# Patient Record
Sex: Female | Born: 1990 | Hispanic: No | Marital: Single | Smoking: Never smoker
Health system: Southern US, Community
[De-identification: ages and names within clinical notes are randomized; demographics above are authoritative.]

## PROBLEM LIST (undated history)

## (undated) ENCOUNTER — Inpatient Hospital Stay: Payer: Self-pay

## (undated) DIAGNOSIS — T7840XA Allergy, unspecified, initial encounter: Secondary | ICD-10-CM

## (undated) DIAGNOSIS — G510 Bell's palsy: Secondary | ICD-10-CM

## (undated) HISTORY — PX: OTHER SURGICAL HISTORY: SHX169

## (undated) HISTORY — DX: Allergy, unspecified, initial encounter: T78.40XA

## (undated) HISTORY — PX: WISDOM TOOTH EXTRACTION: SHX21

---

## 2012-02-04 ENCOUNTER — Emergency Department: Payer: Self-pay | Admitting: Emergency Medicine

## 2012-02-04 LAB — URINALYSIS, COMPLETE
Bacteria: NONE SEEN
Nitrite: NEGATIVE
Specific Gravity: 1.009 (ref 1.003–1.030)
WBC UR: 5 /HPF (ref 0–5)

## 2012-02-04 LAB — PREGNANCY, URINE: Pregnancy Test, Urine: NEGATIVE m[IU]/mL

## 2012-02-04 LAB — CBC WITH DIFFERENTIAL/PLATELET
Basophil #: 0.1 10*3/uL (ref 0.0–0.1)
Basophil %: 1.2 %
Eosinophil #: 0.3 10*3/uL (ref 0.0–0.7)
HGB: 14.6 g/dL (ref 12.0–16.0)
Lymphocyte #: 1.5 10*3/uL (ref 1.0–3.6)
MCHC: 34.6 g/dL (ref 32.0–36.0)
MCV: 88 fL (ref 80–100)
Monocyte #: 0.5 x10 3/mm (ref 0.2–0.9)
Monocyte %: 9.4 %
Neutrophil #: 3.1 10*3/uL (ref 1.4–6.5)
Platelet: 246 10*3/uL (ref 150–440)
RBC: 4.78 10*6/uL (ref 3.80–5.20)
RDW: 12.9 % (ref 11.5–14.5)
WBC: 5.4 10*3/uL (ref 3.6–11.0)

## 2012-02-04 LAB — COMPREHENSIVE METABOLIC PANEL
Anion Gap: 8 (ref 7–16)
Calcium, Total: 9.3 mg/dL (ref 8.5–10.1)
Co2: 27 mmol/L (ref 21–32)
EGFR (Non-African Amer.): >60
Osmolality: 279 (ref 275–301)
Potassium: 3.7 mmol/L (ref 3.5–5.1)
Sodium: 140 mmol/L (ref 136–145)

## 2015-07-19 ENCOUNTER — Emergency Department
Admission: EM | Admit: 2015-07-19 | Discharge: 2015-07-20 | Disposition: A | Discharge status: Home / Self Care | Payer: Self-pay | Attending: Emergency Medicine | Admitting: Emergency Medicine

## 2015-07-19 ENCOUNTER — Encounter: Payer: Self-pay | Admitting: *Deleted

## 2015-07-19 DIAGNOSIS — L989 Disorder of the skin and subcutaneous tissue, unspecified: Secondary | ICD-10-CM | POA: Insufficient documentation

## 2015-07-19 DIAGNOSIS — L03113 Cellulitis of right upper limb: Secondary | ICD-10-CM | POA: Insufficient documentation

## 2015-07-19 DIAGNOSIS — R05 Cough: Secondary | ICD-10-CM | POA: Insufficient documentation

## 2015-07-19 HISTORY — DX: Bell's palsy: G51.0

## 2015-07-19 NOTE — ED Notes (Signed)
Patient states she had an abscess on right forearm that became worse 3 weeks ago. Area is opened, no drainage noted, redness around the wound and hot to the touch. Patient states she has begun to have pain in the elbow and shoulder today.

## 2015-07-20 LAB — CBC WITH DIFFERENTIAL/PLATELET
BASOS ABS: 0.1 10*3/uL (ref 0–0.1)
BASOS PCT: 1 %
EOS ABS: 0.4 10*3/uL (ref 0–0.7)
EOS PCT: 5 %
HCT: 34.8 % — ABNORMAL LOW (ref 35.0–47.0)
Hemoglobin: 11.9 g/dL — ABNORMAL LOW (ref 12.0–16.0)
LYMPHS ABS: 2.4 10*3/uL (ref 1.0–3.6)
Lymphocytes Relative: 32 %
MCH: 30.8 pg (ref 26.0–34.0)
MCHC: 34.3 g/dL (ref 32.0–36.0)
MCV: 89.8 fL (ref 80.0–100.0)
Monocytes Absolute: 0.6 10*3/uL (ref 0.2–0.9)
Monocytes Relative: 8 %
Neutro Abs: 4 10*3/uL (ref 1.4–6.5)
Neutrophils Relative %: 54 %
Platelets: 207 10*3/uL (ref 150–440)
RBC: 3.88 MIL/uL (ref 3.80–5.20)
RDW: 13.2 % (ref 11.5–14.5)
WBC: 7.5 10*3/uL (ref 3.6–11.0)

## 2015-07-20 LAB — BASIC METABOLIC PANEL
Anion gap: 5 (ref 5–15)
BUN: 14 mg/dL (ref 6–20)
CO2: 23 mmol/L (ref 22–32)
CREATININE: 0.67 mg/dL (ref 0.44–1.00)
Calcium: 8.9 mg/dL (ref 8.9–10.3)
Chloride: 112 mmol/L — ABNORMAL HIGH (ref 101–111)
GFR calc Af Amer: >60 mL/min (ref >60)
Glucose, Bld: 86 mg/dL (ref 65–99)
POTASSIUM: 3.3 mmol/L — AB (ref 3.5–5.1)
Sodium: 140 mmol/L (ref 135–145)

## 2015-07-20 MED ORDER — CLINDAMYCIN HCL 300 MG PO CAPS: 300.0000 mg | ORAL_CAPSULE | Freq: Three times a day (TID) | ORAL | Status: DC

## 2015-07-20 MED ORDER — CLINDAMYCIN HCL 150 MG PO CAPS
300.0000 mg | ORAL_CAPSULE | Freq: Once | ORAL | Status: AC
  Administered 2015-07-20: 300 mg via ORAL
  Filled 2015-07-20: qty 2

## 2015-07-20 NOTE — ED Provider Notes (Signed)
[facility] Emergency Department Provider Note  ____________________________________________  Time seen: Approximately 12:46 AM  I have reviewed the triage vital signs and the nursing notes.   HISTORY  Chief Complaint Abscess    HPI Michelle Arellano is a 25 y.o. female who comes in with something on her right arm. The patient reports this been there for 3 weeks. She reports that she noticed a pimple there more than 6 months ago and it wasn't anything. She reports that 3 weeks ago though it became bigger. The patient reports that she tried to poke it and then it ripped. She reports since then she's been trying to keep it covered. The patient noticed some soreness in her elbow and her shoulder and a fever 3 weeks ago so she decided to come in to get this area checked out. The patient reports though that when she had the fever she also had a cough. The patient reports that she took Tylenol at week ago and the arm hurts about 3-4 out of 10 in intensity.    Past Medical History  Diagnosis Date  . Bell's palsy     There are no active problems to display for this patient.   History reviewed. No pertinent past surgical history.  Current Outpatient Rx  Name  Route  Sig  Dispense  Refill  . clindamycin (CLEOCIN) 300 MG capsule   Oral   Take 1 capsule (300 mg total) by mouth 3 (three) times daily.   21 capsule   0     Allergies Review of patient's allergies indicates no known allergies.  No family history on file.  Social History Social History  Substance Use Topics  . Smoking status: Never Smoker   . Smokeless tobacco: None  . Alcohol Use: No    Review of Systems Constitutional: fever/chills Eyes: No visual changes. ENT: No sore throat. Cardiovascular: Denies chest pain. Respiratory: cough Gastrointestinal: No abdominal pain.  No nausea, no vomiting.  No diarrhea.  No constipation. Genitourinary: Negative for dysuria. Musculoskeletal: Negative  for back pain. Skin: lesion to right arm Neurological: Negative for headaches, focal weakness or numbness.  10-point ROS otherwise negative.  ____________________________________________   PHYSICAL EXAM:  VITAL SIGNS: ED Triage Vitals  Enc Vitals Group     BP 07/19/15 2241 130/70 mmHg     Pulse Rate 07/19/15 2241 72     Resp 07/19/15 2241 18     Temp 07/19/15 2241 97.9 F (36.6 C)     Temp Source 07/19/15 2241 Oral     SpO2 07/19/15 2241 98 %     Weight 07/19/15 2241 115 lb (52.164 kg)     Height 07/19/15 2241 5\' 1"  (1.549 m)     Head Cir --      Peak Flow --      Pain Score 07/19/15 2242 4     Pain Loc --      Pain Edu? --      Excl. in GC? --     Constitutional: Alert and oriented. Well appearing and in mild distress. Eyes: Conjunctivae are normal. PERRL. EOMI. Head: Atraumatic. Nose: No congestion/rhinnorhea. Mouth/Throat: Mucous membranes are moist.  Oropharynx non-erythematous. Cardiovascular: Normal rate, regular rhythm. Grossly normal heart sounds.  Good peripheral circulation. Respiratory: Normal respiratory effort.  No retractions. Lungs CTAB. Gastrointestinal: Soft and nontender. No distention. Positive bowel sounds Musculoskeletal: No lower extremity tenderness nor edema.   Neurologic:  Normal speech and language.  Skin:  Keratinized lesion to right  forearm.  2.5 cm x 2 cm Psychiatric: Mood and affect are normal.   ____________________________________________   LABS (all labs ordered are listed, but only abnormal results are displayed)  Labs Reviewed  CBC WITH DIFFERENTIAL/PLATELET - Abnormal; Notable for the following:    Hemoglobin 11.9 (*)    HCT 34.8 (*)    All other components within normal limits  BASIC METABOLIC PANEL - Abnormal; Notable for the following:    Potassium 3.3 (*)    Chloride 112 (*)    All other components within normal limits    ____________________________________________  EKG  none ____________________________________________  RADIOLOGY  none ____________________________________________   PROCEDURES  Procedure(s) performed: None  Critical Care performed: No  ____________________________________________   INITIAL IMPRESSION / ASSESSMENT AND PLAN / ED COURSE  Pertinent labs & imaging results that were available during my care of the patient were reviewed by me and considered in my medical decision making (see chart for details).  This is a 25 year old female who comes in today with a lesion to her right forearm as well as some mild soreness to her right elbow and shoulder. The patient does have a lesion to her forearm that's keratinized but with some mild erythema around it. We will check a CBC and a BMP and I will also give the patient a dose of clindamycin. The patient likely needs to have this area biopsied by dermatology but I will check her blood work first.  The patient's blood work looks unremarkable. Again this patient has a keratinized lesion on her right forearm that slightly fungating with no active drainage. I am concerned and think that this patient needs to have this lesion removed and evaluated by dermatology. The patient otherwise has no acute distress and no other concerns. She does have some erythema around the area so I will treat her for a mild cellulitis but she does need to follow-up. ____________________________________________   FINAL CLINICAL IMPRESSION(S) / ED DIAGNOSES  Final diagnoses:  Arm skin lesion, right  Cellulitis of right upper extremity      Rebecka Apley, MD 07/20/15 [phone]

## 2015-07-20 NOTE — Discharge Instructions (Signed)
Cellulitis Cellulitis is an infection of the skin and the tissue under the skin. The infected area is usually red and tender. This happens most often in the arms and lower legs. HOME CARE   Take your antibiotic medicine as told. Finish the medicine even if you start to feel better.  Keep the infected arm or leg raised (elevated).  Put a warm cloth on the area up to 4 times per day.  Only take medicines as told by your doctor.  Keep all doctor visits as told. GET HELP IF:  You see red streaks on the skin coming from the infected area.  Your red area gets bigger or turns a dark color.  Your bone or joint under the infected area is painful after the skin heals.  Your infection comes back in the same area or different area.  You have a puffy (swollen) bump in the infected area.  You have new symptoms.  You have a fever. GET HELP RIGHT AWAY IF:   You feel very sleepy.  You throw up (vomit) or have watery poop (diarrhea).  You feel sick and have muscle aches and pains.   This information is not intended to replace advice given to you by your health care provider. Make sure you discuss any questions you have with your health care provider.   Document Released: 12/15/2007 Document Revised: 03/19/2015 Document Reviewed: 09/13/2011 Elsevier Interactive Patient Education 2016 [employer]  Excision of Skin Lesions Excision of a skin lesion refers to the removal of a section of skin by making small cuts (incisions) in the skin. This procedure may be done to remove a cancerous (malignant) or noncancerous (benign) growth on the skin. It is typically done to treat or prevent cancer or infection. It may also be done to improve cosmetic appearance. The procedure may be done to remove:  Cancerous growths, such as basal cell carcinoma, squamous cell carcinoma, or melanoma.  Noncancerous growths, such as a cyst or lipoma.  Growths, such as moles or skin tags, which may be removed for  cosmetic reasons. Various excision or surgical techniques may be used depending on your condition, the location of the lesion, and your overall health. LET [facility] PROVIDER KNOW ABOUT:  Any allergies you have.  All medicines you are taking, including vitamins, herbs, eye drops, creams, and over-the-counter medicines.  Previous problems you or members of your family have had with the use of anesthetics.  Any blood disorders you have.  Previous surgeries you have had.  Any medical conditions you have.  Whether you are pregnant or may be pregnant. RISKS AND COMPLICATIONS Generally, this is a safe procedure. However, problems may occur, including:  Bleeding.  Infection.  Scarring.  Recurrence of the cyst, lipoma, or cancer.  Changes in skin sensation or appearance, such as discoloration or swelling.  Reaction to the anesthetics.  Allergic reaction to surgical materials or ointments.  Damage to nerves, blood vessels, muscles, or other structures.  Continued pain. BEFORE THE PROCEDURE  Ask your health care provider about:  Changing or stopping your regular medicines. This is especially important if you are taking diabetes medicines or blood thinners.  Taking medicines such as aspirin and ibuprofen. These medicines can thin your blood. Do not take these medicines before your procedure if your health care provider instructs you not to.  You may be asked to take certain medicines.  You may be asked to stop smoking.  You may have an exam or testing.  Plan  to have someone take you home after the procedure.  Plan to have someone help you with activities during recovery. PROCEDURE  To reduce your risk of infection:  Your health care team will wash or sanitize their hands.  Your skin will be washed with soap.  You will be given a medicine to numb the area (local anesthetic).  One of the following excision techniques will be performed.  At the end of any of  these procedures, antibiotic ointment will be applied as needed. Each of the following techniques may vary among health care providers and hospitals. Complete Surgical Excision The area of skin that needs to be removed will be marked with a pen. Using a small scalpel or scissors, the surgeon will gently cut around and under the lesion until it is completely removed. The lesion will be placed in a fluid and sent to the lab for examination. If necessary, bleeding will be controlled with a device that delivers heat (electrocautery). The edges of the wound may be stitched (sutured) together, and a bandage (dressing) will be applied. This procedure may be performed to treat a cancerous growth or a noncancerous cyst or lesion. Excision of a Cyst The surgeon will make an incision on the cyst. The entire cyst will be removed through the incision. The incision may be closed with sutures. Shave Excision During shave excision, the surgeon will use a small blade or an electrically heated loop instrument to shave off the lesion. This may be done to remove a mole or a skin tag. The wound will usually be left to heal on its own without sutures. Punch Excision During punch excision, the surgeon will use a small tool that is like a cookie cutter or a hole punch to cut a circle shape out of the skin. The outer edges of the skin will be sutured together. This may be done to remove a mole or a scar or to perform a biopsy of the lesion. Mohs Micrographic Surgery During Mohs micrographic surgery, layers of the lesion will be removed with a scalpel or a loop instrument and will be examined right away under a microscope. Layers will be removed until all of the abnormal or cancerous tissue has been removed. This procedure is minimally invasive, and it ensures the best cosmetic outcome. It involves the removal of as little normal tissue as possible. Mohs is usually done to treat skin cancer, such as basal cell carcinoma or  squamous cell carcinoma, particularly on the face and ears. Depending on the size of the surgical wound, it may be sutured closed. AFTER THE PROCEDURE  Return to your normal activities as told by your health care provider.  Talk with your health care provider to discuss any test results, treatment options, and if necessary, the need for more tests.   This information is not intended to replace advice given to you by your health care provider. Make sure you discuss any questions you have with your health care provider.   Document Released: 09/22/2009 Document Revised: 03/19/2015 Document Reviewed: 08/14/2014 Elsevier Interactive Patient Education 2016 Elsevier Inc.  Skin Biopsy WHY AM I HAVING THIS TEST? This test examines skin tissue under a microscope. Your health care provider may perform this test to identify the source of a skin rash or lesion if an allergy is suspected. In this test, a tissue sample (biopsy) is taken to look at your skin's anatomy and to see if certain proteins and antibodies are present. WHAT KIND OF SAMPLE IS TAKEN?  A small sample of skin is required for this test. It is usually collected by numbing an area of skin and removing a small circle of skin. HOW DO I PREPARE FOR THE TEST? There is no preparation required for this test. HOW ARE THE TEST RESULTS REPORTED? Your results may be reported in different ways and are dependent upon the kind of test that is performed. It is your responsibility to obtain your test results. Ask the lab or department performing the test when and how you will get your results. It is most common for your results to be reported as:  Normal skin anatomy (histology).  No evidence of IgG, IgA, or IgM antibody, complement C3, or fibrinogen. WHAT DO THE RESULTS MEAN? Abnormal findings may indicate certain autoimmune diseases. This test can produce many different results. It is important to talk with your health care provider to discuss your  results, treatment options, and if necessary, the need for more tests. Talk with your health care provider if you have any questions about your results.   This information is not intended to replace advice given to you by your health care provider. Make sure you discuss any questions you have with your health care provider.   Document Released: 07/30/2004 Document Revised: 07/19/2014 Document Reviewed: 09/25/2014 Elsevier Interactive Patient Education [employer]

## 2016-07-12 NOTE — L&D Delivery Note (Signed)
0500 Called in room to see patient, RN reports patient complete with bulging bag of water. Requests AROM. SVE: 10/100/+1, vertex, BBOW, caput present.   Maternal pushing efforts initiated. Spontaneous rupture of membranes at 0512, small amount of clear fluid. Maternal pushing efforts continued with little progress due to fetal head position despite frequent position changes and coached pushing. Dr. Valentino Saxon called at [phone] for evaluation for possible assisted delivery.   Dr Valentino Saxon in room to evaluate patient at 17. Effective maternal pushing efforts noted. SVE: 10/100/+3, vertex. Room prepared for second and third stage.   Spontaneous vaginal birth of liveborn female patient at 38. Infant immediately to maternal abdomen. Delayed cord clamping. Three (3) vessel cord. Cord blood collected. APGARS: 8, 9. Weight 6 + 10 pounds. Receiving nurse present at bedside for birth.   Pitocin infusing. Spontaneous delivery of placenta at 0826. First degree perineal laceration and right labial laceration repaired with 3-0 vicryl rapide with epidural and local anesthesia. Left labial laceration, hemostatic unrepaired. EBL: 350 ml.   Initiate routine postpartum care and orders. Mom to postpartum.  Baby to Couplet care / Skin to Skin.   Gunnar Bulla, CNM 04/15/2017, 9:08 AM

## 2016-08-20 ENCOUNTER — Encounter: Payer: Self-pay | Admitting: Certified Nurse Midwife

## 2016-08-20 ENCOUNTER — Ambulatory Visit (INDEPENDENT_AMBULATORY_CARE_PROVIDER_SITE_OTHER): Payer: BLUE CROSS/BLUE SHIELD | Admitting: Certified Nurse Midwife

## 2016-08-20 ENCOUNTER — Other Ambulatory Visit (INDEPENDENT_AMBULATORY_CARE_PROVIDER_SITE_OTHER): Payer: BLUE CROSS/BLUE SHIELD

## 2016-08-20 VITALS — BP 112/66 | HR 86 | Ht 61.0 in | Wt 102.2 lb

## 2016-08-20 DIAGNOSIS — N912 Amenorrhea, unspecified: Secondary | ICD-10-CM

## 2016-08-20 DIAGNOSIS — N926 Irregular menstruation, unspecified: Secondary | ICD-10-CM

## 2016-08-20 DIAGNOSIS — Z3201 Encounter for pregnancy test, result positive: Secondary | ICD-10-CM

## 2016-08-20 LAB — POCT URINE PREGNANCY: PREG TEST UR: POSITIVE — AB

## 2016-08-20 NOTE — Patient Instructions (Signed)

## 2016-08-20 NOTE — Progress Notes (Signed)
GYN ENCOUNTER NOTE  Subjective:       Michelle Arellano is a 26 y.o. [id number] female is here for gynecologic evaluation of the following issues: amenorrhea and positive home pregnancy test. She is accompanied by her significant other, Michelle Arellano.   Michelle Arellano reports a positive home pregnancy test on 08/16/2016. She endorses nausea without vomiting that she is managing with saltines and ginger ale.   She is not currently taking a PNV.   Denies difficulty breathing or respiratory distress, chest pain, abdominal pain, vaginal bleeding, and leg pain or swelling.   Gynecologic History  Patient's last menstrual period was 07/02/2016 (exact date). Irregular  EDD: 04/08/2017  Gestational age: 27 weeks 0 days  Contraception: none   Last Pap: 2017 . Results were: normal  Obstetric History OB History  Gravida Para Term Preterm AB Living  2       1    SAB TAB Ectopic Multiple Live Births    1          # Outcome Date GA Lbr Len/2nd Weight Sex Delivery Anes PTL Lv  2 Current           1 TAB 2006              Past Medical History:  Diagnosis Date  . Bell's palsy     Past Surgical History:  Procedure Laterality Date  . cyst removed     rt arm    No current outpatient prescriptions on file prior to visit.   No current facility-administered medications on file prior to visit.     No Known Allergies  Social History   Social History  . Marital status: Single    Spouse name: N/A  . Number of children: N/A  . Years of education: N/A   Occupational History  . Not on file.   Social History Main Topics  . Smoking status: Never Smoker  . Smokeless tobacco: Never Used  . Alcohol use Yes     Comment: wine occas  . Drug use: No  . Sexual activity: Yes    Birth control/ protection: None   Other Topics Concern  . Not on file   Social History Narrative  . No narrative on file    Family History  Problem Relation Age of Onset  . Diabetes Mother   . Heart disease Maternal  Grandmother   . Heart failure Maternal Grandfather   . Lung cancer Maternal Grandfather   . Asthma Neg Hx   . Ovarian cancer Neg Hx   . Colon cancer Neg Hx     The following portions of the patient's history were reviewed and updated as appropriate: allergies, current medications, past family history, past medical history, past social history, past surgical history and problem list.  Review of Systems  Review of Systems - Negative except as noted above History obtained from the patient   Objective:   BP 112/66   Pulse 86   Ht 5\' 1"  (1.549 m)   Wt 102 lb 3.2 oz (46.4 kg)   LMP 07/02/2016 (Exact Date)   BMI 19.31 kg/m   Alert and oriented x 4  Positive UPT  Physical exam: not indicated  Assessment:   Amenorrhea  Positive pregnancy test    Plan:   1. Viability and dating US today  2. Encouraged PNV with folic acid and DHA  3. Reviewed red flag symptoms and when to call  4. RTC x 1-2 weeks for Nurse intake   Lovell SheehanJenkins  Dani Gobble, CNM

## 2016-08-27 ENCOUNTER — Ambulatory Visit (INDEPENDENT_AMBULATORY_CARE_PROVIDER_SITE_OTHER): Payer: BLUE CROSS/BLUE SHIELD | Admitting: Certified Nurse Midwife

## 2016-08-27 VITALS — BP 99/63 | HR 66 | Ht 62.0 in | Wt 96.0 lb

## 2016-08-27 DIAGNOSIS — Z113 Encounter for screening for infections with a predominantly sexual mode of transmission: Secondary | ICD-10-CM

## 2016-08-27 DIAGNOSIS — Z1389 Encounter for screening for other disorder: Secondary | ICD-10-CM

## 2016-08-27 DIAGNOSIS — Z3481 Encounter for supervision of other normal pregnancy, first trimester: Secondary | ICD-10-CM

## 2016-08-27 NOTE — Progress Notes (Signed)
Michelle BootyAlyssa G Arellano presents for NOB nurse interview visit. Pregnancy confirmation done on 08/20/2016 JML. UPT-positive.  G-2. P-0010. Pregnancy education material explained and given.  No cats in the home. NOB labs ordered.  HIV labs and Drug screen were explained optional and she did not decline. Drug screen ordered. Pt states she did smoke MJ a couple weeks ago but none since she found out she was pregnant. PNV encouraged. Genetic screening options discussed. FOB mother's uncle has Down's. Pt's brother has autism, FOB mother has heart valve defect, and pt has short ring fingers with the knuckle being further down and unable to see it on both hands, along with her sister, both on father's side.  Genetic testing: may do MaterniT.   Pt may discuss with provider. Pt. To follow up with provider in 3-4 weeks for NOB physical.  All questions answered.

## 2016-08-27 NOTE — Patient Instructions (Signed)
Pregnancy and Zika Virus Disease Introduction Zika virus disease, or Zika, is an illness that can spread to people from mosquitoes that carry the virus. It may also spread from person to person through infected body fluids. Zika first occurred in [place], but recently it has spread to new areas. The virus occurs in tropical climates. The location of Zika continues to change. Most people who become infected with Zika virus do not develop serious illness. However, Zika may cause birth defects in an unborn baby whose mother is infected with the virus. It may also increase the risk of miscarriage. What are the symptoms of Zika virus disease? In many cases, people who have been infected with Zika virus do not develop any symptoms. If symptoms appear, they usually start about a week after the person is infected. Symptoms are usually mild. They may include:  Fever.  Rash.  Red eyes.  Joint pain. How does Zika virus disease spread? The main way that Zika virus spreads is through the bite of a certain type of mosquito. Unlike most types of mosquitos, which bite only at night, the type of mosquito that carries Zika virus bites both at night and during the day. Zika virus can also spread through sexual contact, through a blood transfusion, and from a mother to her baby before or during birth. Once you have had Zika virus disease, it is unlikely that you will get it again. Can I pass Zika to my baby during pregnancy? Yes, Zika can pass from a mother to her baby before or during birth. What problems can Zika cause for my baby? A woman who is infected with Zika virus while pregnant is at risk of having her baby born with a condition in which the brain or head is smaller than expected (microcephaly). Babies who have microcephaly can have developmental delays, seizures, hearing problems, and vision problems. Having Zika virus disease during pregnancy can also increase the risk of miscarriage. How can Zika  virus disease be prevented? There is no vaccine to prevent Zika. The best way to prevent the disease is to avoid infected mosquitoes and avoid exposure to body fluids that can spread the virus. Avoid any possible exposure to Zika by taking the following precautions. For women and their sex partners:  Avoid traveling to high-risk areas. The locations where Zika is being reported change often. To identify high-risk areas, check the CDC travel website: www.cdc.gov/zika/geo/index.html  If you or your sex partner must travel to a high-risk area, talk with a health care provider before and after traveling.  Take all precautions to avoid mosquito bites if you live in, or travel to, any of the high-risk areas. Insect repellents are safe to use during pregnancy.  Ask your health care provider when it is safe to have sexual contact. For women:  If you are pregnant or trying to become pregnant, avoid sexual contact with persons who may have been exposed to Zika virus, persons who have possible symptoms of Zika, or persons whose history you are unsure about. If you choose to have sexual contact with someone who may have been exposed to Zika virus, use condoms correctly during the entire duration of sexual activity, every time. Do not share sexual devices, as you may be exposed to body fluids.  Ask your health care provider about when it is safe to attempt pregnancy after a possible exposure to Zika virus. What steps should I take to avoid mosquito bites? Take these steps to avoid mosquito bites when you   are in a high-risk area:  Wear loose clothing that covers your arms and legs.  Limit your outdoor activities.  Do not open windows unless they have window screens.  Sleep under mosquito nets.  Use insect repellent. The best insect repellents have:  DEET, picaridin, oil of lemon eucalyptus (OLE), or IR3535 in them.  Higher amounts of an active ingredient in them.  Remember that insect repellents  are safe to use during pregnancy.  Do not use OLE on children who are younger than 3 years of age. Do not use insect repellent on babies who are younger than 2 months of age.  Cover your child's stroller with mosquito netting. Make sure the netting fits snugly and that any loose netting does not cover your child's mouth or nose. Do not use a blanket as a mosquito-protection cover.  Do not apply insect repellent underneath clothing.  If you are using sunscreen, apply the sunscreen before applying the insect repellent.  Treat clothing with permethrin. Do not apply permethrin directly to your skin. Follow label directions for safe use.  Get rid of standing water, where mosquitoes may reproduce. Standing water is often found in items such as buckets, bowls, animal food dishes, and flowerpots. When you return from traveling to any high-risk area, continue taking actions to protect yourself against mosquito bites for 3 weeks, even if you show no signs of illness. This will prevent spreading Zika virus to uninfected mosquitoes. What should I know about the sexual transmission of Zika? People can spread Zika to their sexual partners during vaginal, anal, or oral sex, or by sharing sexual devices. Many people with Zika do not develop symptoms, so a person could spread the disease without knowing that they are infected. The greatest risk is to women who are pregnant or who may become pregnant. Zika virus can live longer in semen than it can live in blood. Couples can prevent sexual transmission of the virus by:  Using condoms correctly during the entire duration of sexual activity, every time. This includes vaginal, anal, and oral sex.  Not sharing sexual devices. Sharing increases your risk of being exposed to body fluid from another person.  Avoiding all sexual activity until your health care provider says it is safe. Should I be tested for Zika virus? A sample of your blood can be tested for Zika  virus. A pregnant woman should be tested if she may have been exposed to the virus or if she has symptoms of Zika. She may also have additional tests done during her pregnancy, such ultrasound testing. Talk with your health care provider about which tests are recommended. This information is not intended to replace advice given to you by your health care provider. Make sure you discuss any questions you have with your health care provider. Document Released: 03/19/2015 Document Revised: 12/04/2015 Document Reviewed: 03/12/2015  2017 Elsevier Minor Illnesses and Medications in Pregnancy  Cold/Flu:  Sudafed for congestion- Robitussin (plain) for cough- Tylenol for discomfort.  Please follow the directions on the label.  Try not to take any more than needed.  OTC Saline nasal spray and air humidifier or cool-mist  Vaporizer to sooth nasal irritation and to loosen congestion.  It is also important to increase intake of non carbonated fluids, especially if you have a fever.  Constipation:  Colace-2 capsules at bedtime; Metamucil- follow directions on label; Senokot- 1 tablet at bedtime.  Any one of these medications can be used.  It is also very important to increase   fluids and fruits along with regular exercise.  If problem persists please call the office.  Diarrhea:  Kaopectate as directed on the label.  Eat a bland diet and increase fluids.  Avoid highly seasoned foods.  Headache:  Tylenol 1 or 2 tablets every 3-4 hours as needed  Indigestion:  Maalox, Mylanta, Tums or Rolaids- as directed on label.  Also try to eat small meals and avoid fatty, greasy or spicy foods.  Nausea with or without Vomiting:  Nausea in pregnancy is caused by increased levels of hormones in the body which influence the digestive system and cause irritation when stomach acids accumulate.  Symptoms usually subside after 1st trimester of pregnancy.  Try the following: 1. Keep saltines, graham crackers or dry toast by your bed to  eat upon awakening. 2. Don't let your stomach get empty.  Try to eat 5-6 small meals per day instead of 3 large ones. 3. Avoid greasy fatty or highly seasoned foods.  4. Take OTC Unisom 1 tablet at bed time along with OTC Vitamin B6 25-50 mg 3 times per day.    If nausea continues with vomiting and you are unable to keep down food and fluids you may need a prescription medication.  Please notify your provider.   Sore throat:  Chloraseptic spray, throat lozenges and or plain Tylenol.  Vaginal Yeast Infection:  OTC Monistat for 7 days as directed on label.  If symptoms do not resolve within a week notify provider.  If any of the above problems do not subside with recommended treatment please call the office for further assistance.   Do not take Aspirin, Advil, Motrin or Ibuprofen.  * * OTC= Over the counter Hyperemesis Gravidarum Hyperemesis gravidarum is a severe form of nausea and vomiting that happens during pregnancy. Hyperemesis is worse than morning sickness. It may cause you to have nausea or vomiting all day for many days. It may keep you from eating and drinking enough food and liquids. Hyperemesis usually occurs during the first half (the first 20 weeks) of pregnancy. It often goes away once a woman is in her second half of pregnancy. However, sometimes hyperemesis continues through an entire pregnancy. What are the causes? The cause of this condition is not known. It may be related to changes in chemicals (hormones) in the body during pregnancy, such as the high level of pregnancy hormone (human chorionic gonadotropin) or the increase in the female sex hormone (estrogen). What are the signs or symptoms? Symptoms of this condition include:  Severe nausea and vomiting.  Nausea that does not go away.  Vomiting that does not allow you to keep any food down.  Weight loss.  Body fluid loss (dehydration).  Having no desire to eat, or not liking food that you have previously  enjoyed. How is this diagnosed? This condition may be diagnosed based on:  A physical exam.  Your medical history.  Your symptoms.  Blood tests.  Urine tests. How is this treated? This condition may be managed with medicine. If medicines to do not help relieve nausea and vomiting, you may need to receive fluids through an IV tube at the hospital. Follow these instructions at home:  Take over-the-counter and prescription medicines only as told by your health care provider.  Avoid iron pills and multivitamins that contain iron for the first 3-4 months of pregnancy. If you take prescription iron pills, do not stop taking them unless your health care provider approves.  Take the following actions to help   prevent nausea and vomiting:  In the morning, before getting out of bed, try eating a couple of dry crackers or a piece of toast.  Avoid foods and smells that upset your stomach. Fatty and spicy foods may make nausea worse.  Eat 5-6 small meals a day.  Do not drink fluids while eating meals. Drink between meals.  Eat or suck on things that have ginger in them. Ginger can help relieve nausea.  Avoid food preparation. The smell of food can spoil your appetite or trigger nausea.  Follow instructions from your health care provider about eating or drinking restrictions.  For snacks, eat high-protein foods, such as cheese.  Keep all follow-up and pre-birth (prenatal) visits as told by your health care provider. This is important. Contact a health care provider if:  You have pain in your abdomen.  You have a severe headache.  You have vision problems.  You are losing weight. Get help right away if:  You cannot drink fluids without vomiting.  You vomit blood.  You have constant nausea and vomiting.  You are very weak.  You are very thirsty.  You feel dizzy.  You faint.  You have a fever or other symptoms that last for more than 2-3 days.  You have a fever and  your symptoms suddenly get worse. Summary  Hyperemesis gravidarum is a severe form of nausea and vomiting that happens during pregnancy.  Making some changes to your eating habits may help relieve nausea and vomiting.  This condition may be managed with medicine.  If medicines to do not help relieve nausea and vomiting, you may need to receive fluids through an IV tube at the hospital. This information is not intended to replace advice given to you by your health care provider. Make sure you discuss any questions you have with your health care provider. Document Released: 06/28/2005 Document Revised: 02/25/2016 Document Reviewed: 02/25/2016 Elsevier Interactive Patient Education  2017 [employer] Commonly Asked Questions During Pregnancy  Cats: A parasite can be excreted in cat feces.  To avoid exposure you need to have another person empty the little box.  If you must empty the litter box you will need to wear gloves.  Wash your hands after handling your cat.  This parasite can also be found in raw or undercooked meat so this should also be avoided.  Colds, Sore Throats, Flu: Please check your medication sheet to see what you can take for symptoms.  If your symptoms are unrelieved by these medications please call the office.  Dental Work: Most any dental work your dentist recommends is permitted.  X-rays should only be taken during the first trimester if absolutely necessary.  Your abdomen should be shielded with a lead apron during all x-rays.  Please notify your provider prior to receiving any x-rays.  Novocaine is fine; gas is not recommended.  If your dentist requires a note from us prior to dental work please call the office and we will provide one for you.  Exercise: Exercise is an important part of staying healthy during your pregnancy.  You may continue most exercises you were accustomed to prior to pregnancy.  Later in your pregnancy you will most likely notice you have difficulty  with activities requiring balance like riding a bicycle.  It is important that you listen to your body and avoid activities that put you at a higher risk of falling.  Adequate rest and staying well hydrated are a must!  If you have questions   about the safety of specific activities ask your provider.    Exposure to Children with illness: Try to avoid obvious exposure; report any symptoms to us when noted,  If you have chicken pos, red measles or mumps, you should be immune to these diseases.   Please do not take any vaccines while pregnant unless you have checked with your OB provider.  Fetal Movement: After 28 weeks we recommend you do "kick counts" twice daily.  Lie or sit down in a calm quiet environment and count your baby movements "kicks".  You should feel your baby at least 10 times per hour.  If you have not felt 10 kicks within the first hour get up, walk around and have something sweet to eat or drink then repeat for an additional hour.  If count remains less than 10 per hour notify your provider.  Fumigating: Follow your pest control agent's advice as to how long to stay out of your home.  Ventilate the area well before re-entering.  Hemorrhoids:   Most over-the-counter preparations can be used during pregnancy.  Check your medication to see what is safe to use.  It is important to use a stool softener or fiber in your diet and to drink lots of liquids.  If hemorrhoids seem to be getting worse please call the office.   Hot Tubs:  Hot tubs Jacuzzis and saunas are not recommended while pregnant.  These increase your internal body temperature and should be avoided.  Intercourse:  Sexual intercourse is safe during pregnancy as long as you are comfortable, unless otherwise advised by your provider.  Spotting may occur after intercourse; report any bright red bleeding that is heavier than spotting.  Labor:  If you know that you are in labor, please go to the hospital.  If you are unsure, please  call the office and let us help you decide what to do.  Lifting, straining, etc:  If your job requires heavy lifting or straining please check with your provider for any limitations.  Generally, you should not lift items heavier than that you can lift simply with your hands and arms (no back muscles)  Painting:  Paint fumes do not harm your pregnancy, but may make you ill and should be avoided if possible.  Latex or water based paints have less odor than oils.  Use adequate ventilation while painting.  Permanents & Hair Color:  Chemicals in hair dyes are not recommended as they cause increase hair dryness which can increase hair loss during pregnancy.  " Highlighting" and permanents are allowed.  Dye may be absorbed differently and permanents may not hold as well during pregnancy.  Sunbathing:  Use a sunscreen, as skin burns easily during pregnancy.  Drink plenty of fluids; avoid over heating.  Tanning Beds:  Because their possible side effects are still unknown, tanning beds are not recommended.  Ultrasound Scans:  Routine ultrasounds are performed at approximately 20 weeks.  You will be able to see your baby's general anatomy an if you would like to know the gender this can usually be determined as well.  If it is questionable when you conceived you may also receive an ultrasound early in your pregnancy for dating purposes.  Otherwise ultrasound exams are not routinely performed unless there is a medical necessity.  Although you can request a scan we ask that you pay for it when conducted because insurance does not cover " patient request" scans.  Work: If your pregnancy proceeds without complications you   may work until your due date, unless your physician or employer advises otherwise.  Round Ligament Pain/Pelvic Discomfort:  Sharp, shooting pains not associated with bleeding are fairly common, usually occurring in the second trimester of pregnancy.  They tend to be worse when standing up or when  you remain standing for long periods of time.  These are the result of pressure of certain pelvic ligaments called "round ligaments".  Rest, Tylenol and heat seem to be the most effective relief.  As the womb and fetus grow, they rise out of the pelvis and the discomfort improves.  Please notify the office if your pain seems different than that described.  It may represent a more serious condition.   

## 2016-08-28 LAB — CBC WITH DIFFERENTIAL/PLATELET
BASOS ABS: 0.1 10*3/uL (ref 0.0–0.2)
Basos: 1 %
EOS (ABSOLUTE): 0.1 10*3/uL (ref 0.0–0.4)
Eos: 2 %
HEMATOCRIT: 38.2 % (ref 34.0–46.6)
HEMOGLOBIN: 13.4 g/dL (ref 11.1–15.9)
Immature Grans (Abs): 0 10*3/uL (ref 0.0–0.1)
Immature Granulocytes: 0 %
LYMPHS ABS: 1.1 10*3/uL (ref 0.7–3.1)
Lymphs: 19 %
MCH: 31.6 pg (ref 26.6–33.0)
MCHC: 35.1 g/dL (ref 31.5–35.7)
MCV: 90 fL (ref 79–97)
MONOCYTES: 10 %
MONOS ABS: 0.6 10*3/uL (ref 0.1–0.9)
NEUTROS ABS: 4.2 10*3/uL (ref 1.4–7.0)
Neutrophils: 68 %
Platelets: 259 10*3/uL (ref 150–379)
RBC: 4.24 x10E6/uL (ref 3.77–5.28)
RDW: 14 % (ref 12.3–15.4)
WBC: 6.1 10*3/uL (ref 3.4–10.8)

## 2016-08-28 LAB — HEPATITIS B SURFACE ANTIGEN: HEP B S AG: NEGATIVE

## 2016-08-28 LAB — RPR: RPR Ser Ql: NONREACTIVE

## 2016-08-28 LAB — HIV ANTIBODY (ROUTINE TESTING W REFLEX): HIV Screen 4th Generation wRfx: NONREACTIVE

## 2016-08-28 LAB — ABO

## 2016-08-28 LAB — RUBELLA SCREEN: RUBELLA: 1.73 {index} (ref >0.99)

## 2016-08-28 LAB — VARICELLA ZOSTER ANTIBODY, IGG: VARICELLA: 3355 {index} (ref >165)

## 2016-08-28 LAB — RH TYPE: Rh Factor: POSITIVE

## 2016-08-28 LAB — ANTIBODY SCREEN: ANTIBODY SCREEN: NEGATIVE

## 2016-08-29 LAB — URINE CULTURE, OB REFLEX: ORGANISM ID, BACTERIA: NO GROWTH

## 2016-08-29 LAB — CULTURE, OB URINE

## 2016-08-30 LAB — GC/CHLAMYDIA PROBE AMP
Chlamydia trachomatis, NAA: NEGATIVE
Neisseria gonorrhoeae by PCR: NEGATIVE

## 2016-08-31 LAB — NICOTINE SCREEN, URINE: COTININE UR QL SCN: NEGATIVE ng/mL

## 2016-08-31 LAB — URINALYSIS, ROUTINE W REFLEX MICROSCOPIC
BILIRUBIN UA: NEGATIVE
GLUCOSE, UA: NEGATIVE
Leukocytes, UA: NEGATIVE
NITRITE UA: NEGATIVE
Protein, UA: NEGATIVE
RBC UA: NEGATIVE
SPEC GRAV UA: 1.026 (ref 1.005–1.030)
UUROB: 0.2 mg/dL (ref 0.2–1.0)
pH, UA: 7 (ref 5.0–7.5)

## 2016-08-31 LAB — MONITOR DRUG PROFILE 14(MW)
Amphetamine Scrn, Ur: NEGATIVE ng/mL
BARBITURATE SCREEN URINE: NEGATIVE ng/mL
BENZODIAZEPINE SCREEN, URINE: NEGATIVE ng/mL
Buprenorphine, Urine: NEGATIVE ng/mL
COCAINE(METAB.)SCREEN, URINE: NEGATIVE ng/mL
Creatinine(Crt), U: 165.9 mg/dL (ref 20.0–300.0)
FENTANYL, URINE: NEGATIVE pg/mL
MEPERIDINE SCREEN, URINE: NEGATIVE ng/mL
Methadone Screen, Urine: NEGATIVE ng/mL
OPIATE SCREEN URINE: NEGATIVE ng/mL
OXYCODONE+OXYMORPHONE UR QL SCN: NEGATIVE ng/mL
Ph of Urine: 7 (ref 4.5–8.9)
Phencyclidine Qn, Ur: NEGATIVE ng/mL
Propoxyphene Scrn, Ur: NEGATIVE ng/mL
SPECIFIC GRAVITY: 1.02
TRAMADOL SCREEN, URINE: NEGATIVE ng/mL

## 2016-08-31 LAB — CANNABINOID (GC/MS), URINE
Cannabinoid: POSITIVE — AB
Carboxy THC (GC/MS): >300 ng/mL

## 2016-09-17 ENCOUNTER — Encounter: Payer: Self-pay | Admitting: Certified Nurse Midwife

## 2016-09-17 ENCOUNTER — Ambulatory Visit (INDEPENDENT_AMBULATORY_CARE_PROVIDER_SITE_OTHER): Payer: BLUE CROSS/BLUE SHIELD | Admitting: Certified Nurse Midwife

## 2016-09-17 VITALS — BP 112/54 | HR 89 | Wt 99.2 lb

## 2016-09-17 DIAGNOSIS — Z681 Body mass index (BMI) 19 or less, adult: Secondary | ICD-10-CM

## 2016-09-17 DIAGNOSIS — Z124 Encounter for screening for malignant neoplasm of cervix: Secondary | ICD-10-CM

## 2016-09-17 DIAGNOSIS — Z3481 Encounter for supervision of other normal pregnancy, first trimester: Secondary | ICD-10-CM

## 2016-09-17 LAB — POCT URINALYSIS DIPSTICK
BILIRUBIN UA: NEGATIVE
Glucose, UA: NEGATIVE
Ketones, UA: NEGATIVE
LEUKOCYTES UA: NEGATIVE
NITRITE UA: NEGATIVE
RBC UA: NEGATIVE
Spec Grav, UA: 1.01
UROBILINOGEN UA: NEGATIVE
pH, UA: 7.5

## 2016-09-17 NOTE — Progress Notes (Signed)
NOB PE- No complaints.

## 2016-09-17 NOTE — Progress Notes (Signed)
NEW OB HISTORY AND PHYSICAL  SUBJECTIVE:       Michelle Arellano is a 26 y.o. [id number] female, Patient's last menstrual period was 07/02/2016 (exact date)., Estimated Date of Delivery: 04/08/17, [redacted]w[redacted]d, presents today for establishment of Prenatal Care.  She reports that her nausea is improving with home measures. She takes a prenatal vitamin daily.  Desires second trimester screening.   Denies difficulty breathing or respiratory distress, visual changes, chest pain, abdominal pain, vaginal bleeding, and leg pain or swelling.    Gynecologic History  Patient's last menstrual period was 07/02/2016 (exact date).   Last Pap: 2017. Results were: normal  Obstetric History OB History  Gravida Para Term Preterm AB Living  2       1    SAB TAB Ectopic Multiple Live Births    1          # Outcome Date GA Lbr Len/2nd Weight Sex Delivery Anes PTL Lv  2 Current           1 TAB 2006              Past Medical History:  Diagnosis Date  . Bell's palsy     Past Surgical History:  Procedure Laterality Date  . cyst removed     rt arm    Current Outpatient Prescriptions on File Prior to Visit  Medication Sig Dispense Refill  . prenatal vitamin w/FE, FA (PRENATAL 1 + 1) 27-1 MG TABS tablet Take 1 tablet by mouth daily at 12 noon.     No current facility-administered medications on file prior to visit.     No Known Allergies  Social History   Social History  . Marital status: Single    Spouse name: N/A  . Number of children: N/A  . Years of education: N/A   Occupational History  . Not on file.   Social History Main Topics  . Smoking status: Never Smoker  . Smokeless tobacco: Never Used  . Alcohol use Yes     Comment: wine occas  . Drug use: Yes    Frequency: 1.0 time per week    Types: Marijuana     Comment: sttopped when she found out she was pregnant  . Sexual activity: Yes    Partners: Male    Birth control/ protection: None   Other Topics Concern  . Not on file    Social History Narrative  . No narrative on file    Family History  Problem Relation Age of Onset  . Diabetes Mother   . Heart disease Maternal Grandmother   . Heart failure Maternal Grandfather   . Lung cancer Maternal Grandfather   . Asthma Neg Hx   . Ovarian cancer Neg Hx   . Colon cancer Neg Hx     The following portions of the patient's history were reviewed and updated as appropriate: allergies, current medications, past OB history, past medical history, past surgical history, past family history, past social history, and problem list.  OBJECTIVE: Initial Physical Exam (New OB)  GENERAL APPEARANCE: alert, well appearing, in no apparent distress  HEAD: normocephalic, atraumatic  MOUTH: mucous membranes moist, pharynx normal without lesions and dental hygiene good  THYROID: no thyromegaly or masses present  BREASTS: no masses noted, no significant tenderness, no palpable axillary nodes, no skin changes  LUNGS: clear to auscultation, no wheezes, rales or rhonchi, symmetric air entry  HEART: regular rate and rhythm, no murmurs  ABDOMEN: soft, nontender, nondistended, no abnormal masses,  no epigastric pain, fundus not palpable and FHT present  EXTREMITIES: no redness or tenderness in the calves or thighs, no edema  SKIN: normal coloration and turgor, no rashes, 25% covered in professional tattoos  LYMPH NODES: no adenopathy palpable  NEUROLOGIC: alert, oriented, normal speech, no focal findings or movement disorder noted  PELVIC EXAM EXTERNAL GENITALIA: normal appearing vulva with no masses, tenderness or lesions VAGINA: no abnormal discharge or lesions CERVIX: no lesions or cervical motion tenderness UTERUS: gravid and consistent with 11 weeks ADNEXA: no masses palpable and nontender  ASSESSMENT: Normal pregnancy BMI <19  PLAN: Prenatal care Discussed healthy eating and weight gain in pregnancy Desires second trimester screening See orders   Gunnar BullaJenkins  Michelle Kyzen Horn, CNM

## 2016-09-17 NOTE — Patient Instructions (Signed)
Second Trimester of Pregnancy The second trimester is from week 13 through week 28, month 4 through 6. This is often the time in pregnancy that you feel your best. Often times, morning sickness has lessened or quit. You may have more energy, and you may get hungry more often. Your unborn baby (fetus) is growing rapidly. At the end of the sixth month, he or she is about 9 inches long and weighs about 1 pounds. You will likely feel the baby move (quickening) between 18 and 20 weeks of pregnancy. Follow these instructions at home:  Avoid all smoking, herbs, and alcohol. Avoid drugs not approved by your doctor.  Do not use any tobacco products, including cigarettes, chewing tobacco, and electronic cigarettes. If you need help quitting, ask your doctor. You may get counseling or other support to help you quit.  Only take medicine as told by your doctor. Some medicines are safe and some are not during pregnancy.  Exercise only as told by your doctor. Stop exercising if you start having cramps.  Eat regular, healthy meals.  Wear a good support bra if your breasts are tender.  Do not use hot tubs, steam rooms, or saunas.  Wear your seat belt when driving.  Avoid raw meat, uncooked cheese, and liter boxes and soil used by cats.  Take your prenatal vitamins.  Take 1500-2000 milligrams of calcium daily starting at the 20th week of pregnancy until you deliver your baby.  Try taking medicine that helps you poop (stool softener) as needed, and if your doctor approves. Eat more fiber by eating fresh fruit, vegetables, and whole grains. Drink enough fluids to keep your pee (urine) clear or pale yellow.  Take warm water baths (sitz baths) to soothe pain or discomfort caused by hemorrhoids. Use hemorrhoid cream if your doctor approves.  If you have puffy, bulging veins (varicose veins), wear support hose. Raise (elevate) your feet for 15 minutes, 3-4 times a day. Limit salt in your diet.  Avoid heavy  lifting, wear low heals, and sit up straight.  Rest with your legs raised if you have leg cramps or low back pain.  Visit your dentist if you have not gone during your pregnancy. Use a soft toothbrush to brush your teeth. Be gentle when you floss.  You can have sex (intercourse) unless your doctor tells you not to.  Go to your doctor visits. Get help if:  You feel dizzy.  You have mild cramps or pressure in your lower belly (abdomen).  You have a nagging pain in your belly area.  You continue to feel sick to your stomach (nauseous), throw up (vomit), or have watery poop (diarrhea).  You have bad smelling fluid coming from your vagina.  You have pain with peeing (urination). Get help right away if:  You have a fever.  You are leaking fluid from your vagina.  You have spotting or bleeding from your vagina.  You have severe belly cramping or pain.  You lose or gain weight rapidly.  You have trouble catching your breath and have chest pain.  You notice sudden or extreme puffiness (swelling) of your face, hands, ankles, feet, or legs.  You have not felt the baby move in over an hour.  You have severe headaches that do not go away with medicine.  You have vision changes. This information is not intended to replace advice given to you by your health care provider. Make sure you discuss any questions you have with your health care   provider. Document Released: 09/22/2009 Document Revised: 12/04/2015 Document Reviewed: 08/29/2012 Elsevier Interactive Patient Education  2017 [employer]  

## 2016-09-22 LAB — PAP IG W/ RFLX HPV ASCU: PAP SMEAR COMMENT: 0

## 2016-09-27 ENCOUNTER — Encounter: Payer: Self-pay | Admitting: Certified Nurse Midwife

## 2016-10-15 ENCOUNTER — Ambulatory Visit (INDEPENDENT_AMBULATORY_CARE_PROVIDER_SITE_OTHER): Payer: BLUE CROSS/BLUE SHIELD | Admitting: Certified Nurse Midwife

## 2016-10-15 ENCOUNTER — Encounter: Payer: Self-pay | Admitting: Certified Nurse Midwife

## 2016-10-15 VITALS — BP 99/55 | HR 81 | Wt 105.0 lb

## 2016-10-15 DIAGNOSIS — Z3402 Encounter for supervision of normal first pregnancy, second trimester: Secondary | ICD-10-CM

## 2016-10-15 LAB — POCT URINALYSIS DIPSTICK
BILIRUBIN UA: NEGATIVE
Glucose, UA: NEGATIVE
Ketones, UA: NEGATIVE
LEUKOCYTES UA: NEGATIVE
NITRITE UA: NEGATIVE
PH UA: 8 (ref 5.0–8.0)
Protein, UA: NEGATIVE
RBC UA: NEGATIVE
Spec Grav, UA: 1.01 (ref 1.030–1.035)
UROBILINOGEN UA: NEGATIVE (ref ?–2.0)

## 2016-10-15 NOTE — Progress Notes (Signed)
ROB- want AFP tetra.

## 2016-10-15 NOTE — Progress Notes (Signed)
ROB-Pt doing well. Completed treatment for a sinus infection last week. Desires second trimester screen with next visit. Reviewed red flag symptoms and when to call. RTC x 4 weeks for anatomy scan, Quad screen, and ROB.

## 2016-10-15 NOTE — Patient Instructions (Signed)
Alpha-Fetoprotein Test Why am I having this test? This test is used to screen for birth defects, such as chromosomal abnormalities, neural tube defects, and body wall defects. It can also be used as a tumor marker for certain cancers. Alpha-Fetoprotein (AFP) is a protein that is made by the fetal liver. Levels can be detected during pregnancy, starting at [redacted] weeks gestation and peaking at 16-[redacted] weeks gestation. Your health care provider may perform this test if you are pregnant or if a tumor is suspected. What kind of sample is taken? A blood sample is required for this test. It is usually collected by inserting a needle into a vein. How do I prepare for this test? There is no preparation or fasting required for this test. What are the reference ranges? References ranges are considered healthy ranges established after testing a large group of healthy people. Reference ranges may vary among different people, labs, and hospitals. It is your responsibility to obtain your test results. Ask the lab or department performing the test when and how you will get your results. Reference ranges for AFP are the following:  Adult: Less than 40 ng/mL or less than 40 mcg/L (SI units).  Child younger than 1 year: Less than 30 ng/mL. Ranges are stratified by weeks of gestation, and they vary among laboratories. What do the results mean? Values above the reference ranges in pregnant women may indicate:  Neural tube defects.  Abdominal wall defects.  Multiple pregnancy.  Congenital abnormalities.  Fetal distress or fetal death. Values above the reference ranges in nonpregnant women may indicate:  Reproductive cancers.  Liver cancer.  Liver cell death.  Other types of cancer. Values below the reference ranges in pregnant women may indicate:  Down syndrome.  Fetal death. Talk with your health care provider to discuss your results, treatment options, and if necessary, the need for more tests. Talk  with your health care provider if you have any questions about your results. Talk with your health care provider to discuss your results, treatment options, and if necessary, the need for more tests. Talk with your health care provider if you have any questions about your results. This information is not intended to replace advice given to you by your health care provider. Make sure you discuss any questions you have with your health care provider. Document Released: 07/22/2004 Document Revised: 03/01/2016 Document Reviewed: 11/30/2013 Elsevier Interactive Patient Education  2017 [employer].

## 2016-11-12 ENCOUNTER — Other Ambulatory Visit: Payer: Self-pay | Admitting: Certified Nurse Midwife

## 2016-11-12 ENCOUNTER — Ambulatory Visit (INDEPENDENT_AMBULATORY_CARE_PROVIDER_SITE_OTHER): Payer: BLUE CROSS/BLUE SHIELD

## 2016-11-12 ENCOUNTER — Ambulatory Visit (INDEPENDENT_AMBULATORY_CARE_PROVIDER_SITE_OTHER): Payer: BLUE CROSS/BLUE SHIELD | Admitting: Certified Nurse Midwife

## 2016-11-12 VITALS — BP 100/61 | HR 84 | Wt 111.8 lb

## 2016-11-12 DIAGNOSIS — Z3482 Encounter for supervision of other normal pregnancy, second trimester: Secondary | ICD-10-CM

## 2016-11-12 DIAGNOSIS — Z3402 Encounter for supervision of normal first pregnancy, second trimester: Secondary | ICD-10-CM

## 2016-11-12 DIAGNOSIS — Z3A19 19 weeks gestation of pregnancy: Secondary | ICD-10-CM

## 2016-11-12 DIAGNOSIS — O283 Abnormal ultrasonic finding on antenatal screening of mother: Secondary | ICD-10-CM

## 2016-11-12 DIAGNOSIS — Z3490 Encounter for supervision of normal pregnancy, unspecified, unspecified trimester: Secondary | ICD-10-CM | POA: Insufficient documentation

## 2016-11-12 LAB — POCT URINALYSIS DIPSTICK
Bilirubin, UA: NEGATIVE
Blood, UA: NEGATIVE
GLUCOSE UA: NEGATIVE
KETONES UA: NEGATIVE
Nitrite, UA: NEGATIVE
PROTEIN UA: NEGATIVE
SPEC GRAV UA: 1.025 (ref 1.010–1.025)
UROBILINOGEN UA: 0.2 U/dL
pH, UA: 6 (ref 5.0–8.0)

## 2016-11-12 NOTE — Progress Notes (Signed)
ROB doing well. Anatomy scan today. There is an echogenic focus seen in the fetal left cardiac ventricle, all other anatomy appears WNL.Reviewed finding with Janat. Consult to MFM for evaluation. She denies LOF, vaginal bleeding, and contractions. Endorses good fetal movement. Second trimester screening today. ROB in 4 wks.   Doreene BurkeAnnie Doaa Kendzierski, CNM  ULTRASOUND REPORT  Location: [facility] Date of Service: 11/12/16  Indications: Anatomy Findings:  Singleton intrauterine pregnancy is visualized with FHR at 157 BPM. Biometrics give an (U/S) Gestational age of [redacted] weeks and 3 days, and an (U/S) EDD of 04/12/17; this correlates with the clinically established EDD of 04/08/17.  Fetal presentation is breech, spine posterior.  EFW: 249 grams ( 0 lbs. 9 oz). Placenta: Posterior, grade 1 and remote to cervix by 3.9 cm. AFI: Subjectively adequate with an MVP of 2.7 cm.  Anatomic survey is complete. There is an echogenic focus seen in the fetal left cardiac ventricle, all other anatomy appears WNL. Gender - Female.   Right Ovary is not visualized. Left Ovary measures 2.9 x 1.8 x 2.6 cm, and appears WNL. There is no evidence of a corpus luteal cyst. Survey of the adnexa demonstrates no adnexal masses. There is no free peritoneal fluid in the cul de sac.  Impression: 1. 18 week 3 day Viable Singleton Intrauterine pregnancy by U/S. 2. (U/S) EDD is consistent with Clinically established (LMP) EDD of 04/08/17. 3. Fetal cardiac left ventricle echogenic focus, all other anatomy appears WNL.  Recommendations: 1.Clinical correlation with the patient's History and Physical Exam.   Revonda Humphreyeresa Elliott, RDMS, RVT

## 2016-11-12 NOTE — Patient Instructions (Signed)

## 2016-11-17 LAB — AFP, QUAD SCREEN
DIA MOM VALUE: 0.81
DIA VALUE (EIA): 175.08 pg/mL
DSR (By Age)    1 IN: 835
DSR (Second Trimester) 1 IN: 10000
GESTATIONAL AGE AFP: 19 wk
MSAFP Mom: 1.04
MSAFP: 62.5 ng/mL
MSHCG Mom: 1.49
MSHCG: 46307 m[IU]/mL
Maternal Age At EDD: 28.3 yr
OSB RISK: 10000
T18 (By Age): 1:3252 {titer}
Test Results:: NEGATIVE
UE3 MOM: 1.17
UE3 VALUE: 1.98 ng/mL
Weight: 111 [lb_av]

## 2016-11-19 ENCOUNTER — Other Ambulatory Visit: Payer: Self-pay | Admitting: Certified Nurse Midwife

## 2016-11-19 DIAGNOSIS — O30009 Twin pregnancy, unspecified number of placenta and unspecified number of amniotic sacs, unspecified trimester: Secondary | ICD-10-CM

## 2016-11-19 DIAGNOSIS — Z3689 Encounter for other specified antenatal screening: Secondary | ICD-10-CM

## 2016-11-22 ENCOUNTER — Encounter: Payer: Self-pay | Admitting: Certified Nurse Midwife

## 2016-11-29 ENCOUNTER — Ambulatory Visit: Payer: BLUE CROSS/BLUE SHIELD

## 2016-11-29 ENCOUNTER — Ambulatory Visit
Admission: RE | Admit: 2016-11-29 | Discharge: 2016-11-29 | Disposition: A | Discharge status: Home / Self Care | Payer: BLUE CROSS/BLUE SHIELD | Source: Ambulatory Visit | Attending: Maternal & Fetal Medicine | Admitting: Maternal & Fetal Medicine

## 2016-11-29 DIAGNOSIS — Z3A21 21 weeks gestation of pregnancy: Secondary | ICD-10-CM | POA: Insufficient documentation

## 2016-11-29 DIAGNOSIS — Z3689 Encounter for other specified antenatal screening: Secondary | ICD-10-CM

## 2016-11-29 DIAGNOSIS — O30009 Twin pregnancy, unspecified number of placenta and unspecified number of amniotic sacs, unspecified trimester: Secondary | ICD-10-CM

## 2016-11-29 DIAGNOSIS — O283 Abnormal ultrasonic finding on antenatal screening of mother: Secondary | ICD-10-CM | POA: Insufficient documentation

## 2016-12-10 ENCOUNTER — Ambulatory Visit (INDEPENDENT_AMBULATORY_CARE_PROVIDER_SITE_OTHER): Payer: BLUE CROSS/BLUE SHIELD | Admitting: Certified Nurse Midwife

## 2016-12-10 ENCOUNTER — Encounter: Payer: Self-pay | Admitting: Certified Nurse Midwife

## 2016-12-10 VITALS — BP 101/61 | HR 81 | Wt 120.3 lb

## 2016-12-10 DIAGNOSIS — Z130 Encounter for screening for diseases of the blood and blood-forming organs and certain disorders involving the immune mechanism: Secondary | ICD-10-CM

## 2016-12-10 DIAGNOSIS — Z131 Encounter for screening for diabetes mellitus: Secondary | ICD-10-CM

## 2016-12-10 DIAGNOSIS — Z3492 Encounter for supervision of normal pregnancy, unspecified, second trimester: Secondary | ICD-10-CM

## 2016-12-10 LAB — POCT URINALYSIS DIPSTICK
BILIRUBIN UA: NEGATIVE
GLUCOSE UA: NEGATIVE
KETONES UA: NEGATIVE
Leukocytes, UA: NEGATIVE
Nitrite, UA: NEGATIVE
PH UA: 5 (ref 5.0–8.0)
Protein, UA: NEGATIVE
RBC UA: NEGATIVE
Spec Grav, UA: 1.025 (ref 1.010–1.025)
Urobilinogen, UA: 0.2 E.U./dL

## 2016-12-10 NOTE — Progress Notes (Signed)
ROB-Pt doing well, reports occasional dry cough. Discussed home treatment measures including use of humidifier and OTC medications. Anticipatory guidance regarding 28 week visit and labs. Baby will be named Georga KaufmannBronwyn. Reviewed red flag symptoms and when to call. RTC  4-5 weeks for glucola, H/H, TDaP, and ROB.

## 2016-12-10 NOTE — Progress Notes (Signed)
Pt is here for a routine OB visit. Doing well. No c/o

## 2016-12-10 NOTE — Patient Instructions (Addendum)
Second Trimester of Pregnancy The second trimester is from week 14 through week 27 (months 4 through 6). The second trimester is often a time when you feel your best. Your body has adjusted to being pregnant, and you begin to feel better physically. Usually, morning sickness has lessened or quit completely, you may have more energy, and you may have an increase in appetite. The second trimester is also a time when the fetus is growing rapidly. At the end of the sixth month, the fetus is about 9 inches long and weighs about 1 pounds. You will likely begin to feel the baby move (quickening) between 16 and 20 weeks of pregnancy. Body changes during your second trimester Your body continues to go through many changes during your second trimester. The changes vary from woman to woman.  Your weight will continue to increase. You will notice your lower abdomen bulging out.  You may begin to get stretch marks on your hips, abdomen, and breasts.  You may develop headaches that can be relieved by medicines. The medicines should be approved by your health care provider.  You may urinate more often because the fetus is pressing on your bladder.  You may develop or continue to have heartburn as a result of your pregnancy.  You may develop constipation because certain hormones are causing the muscles that push waste through your intestines to slow down.  You may develop hemorrhoids or swollen, bulging veins (varicose veins).  You may have back pain. This is caused by: ? Weight gain. ? Pregnancy hormones that are relaxing the joints in your pelvis. ? A shift in weight and the muscles that support your balance.  Your breasts will continue to grow and they will continue to become tender.  Your gums may bleed and may be sensitive to brushing and flossing.  Dark spots or blotches (chloasma, mask of pregnancy) may develop on your face. This will likely fade after the baby is born.  A dark line from your  belly button to the pubic area (linea nigra) may appear. This will likely fade after the baby is born.  You may have changes in your hair. These can include thickening of your hair, rapid growth, and changes in texture. Some women also have hair loss during or after pregnancy, or hair that feels dry or thin. Your hair will most likely return to normal after your baby is born.  What to expect at prenatal visits During a routine prenatal visit:  You will be weighed to make sure you and the fetus are growing normally.  Your blood pressure will be taken.  Your abdomen will be measured to track your baby's growth.  The fetal heartbeat will be listened to.  Any test results from the previous visit will be discussed.  Your health care provider may ask you:  How you are feeling.  If you are feeling the baby move.  If you have had any abnormal symptoms, such as leaking fluid, bleeding, severe headaches, or abdominal cramping.  If you are using any tobacco products, including cigarettes, chewing tobacco, and electronic cigarettes.  If you have any questions.  Other tests that may be performed during your second trimester include:  Blood tests that check for: ? Low iron levels (anemia). ? High blood sugar that affects pregnant women (gestational diabetes) between 82 and 28 weeks. ? Rh antibodies. This is to check for a protein on red blood cells (Rh factor).  Urine tests to check for infections, diabetes, or  protein in the urine.  An ultrasound to confirm the proper growth and development of the baby.  An amniocentesis to check for possible genetic problems.  Fetal screens for spina bifida and Down syndrome.  HIV (human immunodeficiency virus) testing. Routine prenatal testing includes screening for HIV, unless you choose not to have this test.  Follow these instructions at home: Medicines  Follow your health care provider's instructions regarding medicine use. Specific  medicines may be either safe or unsafe to take during pregnancy.  Take a prenatal vitamin that contains at least 600 micrograms (mcg) of folic acid.  If you develop constipation, try taking a stool softener if your health care provider approves. Eating and drinking  Eat a balanced diet that includes fresh fruits and vegetables, whole grains, good sources of protein such as meat, eggs, or tofu, and low-fat dairy. Your health care provider will help you determine the amount of weight gain that is right for you.  Avoid raw meat and uncooked cheese. These carry germs that can cause birth defects in the baby.  If you have low calcium intake from food, talk to your health care provider about whether you should take a daily calcium supplement.  Limit foods that are high in fat and processed sugars, such as fried and sweet foods.  To prevent constipation: ? Drink enough fluid to keep your urine clear or pale yellow. ? Eat foods that are high in fiber, such as fresh fruits and vegetables, whole grains, and beans. Activity  Exercise only as directed by your health care provider. Most women can continue their usual exercise routine during pregnancy. Try to exercise for 30 minutes at least 5 days a week. Stop exercising if you experience uterine contractions.  Avoid heavy lifting, wear low heel shoes, and practice good posture.  A sexual relationship may be continued unless your health care provider directs you otherwise. Relieving pain and discomfort  Wear a good support bra to prevent discomfort from breast tenderness.  Take warm sitz baths to soothe any pain or discomfort caused by hemorrhoids. Use hemorrhoid cream if your health care provider approves.  Rest with your legs elevated if you have leg cramps or low back pain.  If you develop varicose veins, wear support hose. Elevate your feet for 15 minutes, 3-4 times a day. Limit salt in your diet. Prenatal Care  Write down your questions.  Take them to your prenatal visits.  Keep all your prenatal visits as told by your health care provider. This is important. Safety  Wear your seat belt at all times when driving.  Make a list of emergency phone numbers, including numbers for family, friends, the hospital, and police and fire departments. General instructions  Ask your health care provider for a referral to a local prenatal education class. Begin classes no later than the beginning of month 6 of your pregnancy.  Ask for help if you have counseling or nutritional needs during pregnancy. Your health care provider can offer advice or refer you to specialists for help with various needs.  Do not use hot tubs, steam rooms, or saunas.  Do not douche or use tampons or scented sanitary pads.  Do not cross your legs for long periods of time.  Avoid cat litter boxes and soil used by cats. These carry germs that can cause birth defects in the baby and possibly loss of the fetus by miscarriage or stillbirth.  Avoid all smoking, herbs, alcohol, and unprescribed drugs. Chemicals in these products can  affect the formation and growth of the baby.  Do not use any products that contain nicotine or tobacco, such as cigarettes and e-cigarettes. If you need help quitting, ask your health care provider.  Visit your dentist if you have not gone yet during your pregnancy. Use a soft toothbrush to brush your teeth and be gentle when you floss. Contact a health care provider if:  You have dizziness.  You have mild pelvic cramps, pelvic pressure, or nagging pain in the abdominal area.  You have persistent nausea, vomiting, or diarrhea.  You have a bad smelling vaginal discharge.  You have pain when you urinate. Get help right away if:  You have a fever.  You are leaking fluid from your vagina.  You have spotting or bleeding from your vagina.  You have severe abdominal cramping or pain.  You have rapid weight gain or weight  loss.  You have shortness of breath with chest pain.  You notice sudden or extreme swelling of your face, hands, ankles, feet, or legs.  You have not felt your baby move in over an hour.  You have severe headaches that do not go away when you take medicine.  You have vision changes. Summary  The second trimester is from week 14 through week 27 (months 4 through 6). It is also a time when the fetus is growing rapidly.  Your body goes through many changes during pregnancy. The changes vary from woman to woman.  Avoid all smoking, herbs, alcohol, and unprescribed drugs. These chemicals affect the formation and growth your baby.  Do not use any tobacco products, such as cigarettes, chewing tobacco, and e-cigarettes. If you need help quitting, ask your health care provider.  Contact your health care provider if you have any questions. Keep all prenatal visits as told by your health care provider. This is important. This information is not intended to replace advice given to you by your health care provider. Make sure you discuss any questions you have with your health care provider. Document Released: 06/22/2001 Document Revised: 12/04/2015 Document Reviewed: 08/29/2012 Elsevier Interactive Patient Education  2017 [employer]. Common Medications Safe in Pregnancy  Acne:      Constipation:  Benzoyl Peroxide     Colace  Clindamycin      Dulcolax Suppository  Topica Erythromycin     Fibercon  Salicylic Acid      Metamucil         Miralax AVOID:        Senakot   Accutane    Cough:  Retin-A       Cough Drops  Tetracycline      Phenergan w/ Codeine if Rx  Minocycline      Robitussin (Plain & DM)  Antibiotics:     Crabs/Lice:  Ceclor       RID  Cephalosporins    AVOID:  E-Mycins      Kwell  Keflex  Macrobid/Macrodantin   Diarrhea:  Penicillin      Kao-Pectate  Zithromax      Imodium AD         PUSH FLUIDS AVOID:       Cipro     Fever:  Tetracycline      Tylenol (Regular or  Extra  Minocycline       Strength)  Levaquin      Extra Strength-Do not          Exceed 8 tabs/24 hrs Caffeine:        <223m/day (  equiv. To 1 cup of coffee or  approx. 3 12 oz sodas)         Gas: Cold/Hayfever:       Gas-X  Benadryl      Mylicon  Claritin       Phazyme  **Claritin-D        Chlor-Trimeton    Headaches:  Dimetapp      ASA-Free Excedrin  Drixoral-Non-Drowsy     Cold Compress  Mucinex (Guaifenasin)     Tylenol (Regular or Extra  Sudafed/Sudafed-12 Hour     Strength)  **Sudafed PE Pseudoephedrine   Tylenol Cold & Sinus     Vicks Vapor Rub  Zyrtec  **AVOID if Problems With Blood Pressure         Heartburn: Avoid lying down for at least 1 hour after meals  Aciphex      Maalox     Rash:  Milk of Magnesia     Benadryl    Mylanta       1% Hydrocortisone Cream  Pepcid  Pepcid Complete   Sleep Aids:  Prevacid      Ambien   Prilosec       Benadryl  Rolaids       Chamomile Tea  Tums (Limit 4/day)     Unisom  Zantac       Tylenol PM         Warm milk-add vanilla or  Hemorrhoids:       Sugar for taste  Anusol/Anusol H.C.  (RX: Analapram 2.5%)  Sugar Substitutes:  Hydrocortisone OTC     Ok in moderation  Preparation H      Tucks        Vaseline lotion applied to tissue with wiping    Herpes:     Throat:  Acyclovir      Oragel  Famvir  Valtrex     Vaccines:         Flu Shot Leg Cramps:       *Gardasil  Benadryl      Hepatitis A         Hepatitis B Nasal Spray:       Pneumovax  Saline Nasal Spray     Polio Booster         Tetanus Nausea:       Tuberculosis test or PPD  Vitamin B6 25 mg TID   AVOID:    Dramamine      *Gardasil  Emetrol       Live Poliovirus  Ginger Root 250 mg QID    MMR (measles, mumps &  High Complex Carbs @ Bedtime    rebella)  Sea Bands-Accupressure    Varicella (Chickenpox)  Unisom 1/2 tab TID     *No known complications           If received before Pain:         Known pregnancy;   Darvocet       Resume series  after  Lortab        Delivery  Percocet    Yeast:   Tramadol      Femstat  Tylenol 3      Gyne-lotrimin  Ultram       Monistat  Vicodin           MISC:         All Sunscreens           Hair Coloring/highlights          Insect Repellant's          (  Including DEET)         Mystic Tans

## 2017-01-07 ENCOUNTER — Ambulatory Visit (INDEPENDENT_AMBULATORY_CARE_PROVIDER_SITE_OTHER): Payer: BLUE CROSS/BLUE SHIELD | Admitting: Certified Nurse Midwife

## 2017-01-07 ENCOUNTER — Other Ambulatory Visit: Payer: BLUE CROSS/BLUE SHIELD

## 2017-01-07 VITALS — BP 110/80 | HR 88 | Wt 128.7 lb

## 2017-01-07 DIAGNOSIS — Z130 Encounter for screening for diseases of the blood and blood-forming organs and certain disorders involving the immune mechanism: Secondary | ICD-10-CM

## 2017-01-07 DIAGNOSIS — Z3482 Encounter for supervision of other normal pregnancy, second trimester: Secondary | ICD-10-CM

## 2017-01-07 DIAGNOSIS — K219 Gastro-esophageal reflux disease without esophagitis: Secondary | ICD-10-CM

## 2017-01-07 DIAGNOSIS — Z131 Encounter for screening for diabetes mellitus: Secondary | ICD-10-CM

## 2017-01-07 DIAGNOSIS — O99619 Diseases of the digestive system complicating pregnancy, unspecified trimester: Secondary | ICD-10-CM

## 2017-01-07 DIAGNOSIS — Z23 Encounter for immunization: Secondary | ICD-10-CM

## 2017-01-07 DIAGNOSIS — Z3492 Encounter for supervision of normal pregnancy, unspecified, second trimester: Secondary | ICD-10-CM

## 2017-01-07 LAB — POCT URINALYSIS DIPSTICK
Bilirubin, UA: NEGATIVE
Glucose, UA: NEGATIVE
KETONES UA: NEGATIVE
Nitrite, UA: NEGATIVE
PH UA: 6.5 (ref 5.0–8.0)
PROTEIN UA: NEGATIVE
RBC UA: NEGATIVE
SPEC GRAV UA: 1.01 (ref 1.010–1.025)
Urobilinogen, UA: 0.2 E.U./dL

## 2017-01-07 MED ORDER — RANITIDINE HCL 150 MG PO TABS: 150.0000 mg | ORAL_TABLET | Freq: Two times a day (BID) | ORAL | 3 refills | Status: DC

## 2017-01-07 MED ORDER — TETANUS-DIPHTH-ACELL PERTUSSIS 5-2.5-18.5 LF-MCG/0.5 IM SUSP
0.5000 mL | Freq: Once | INTRAMUSCULAR | Status: AC
  Administered 2017-01-07: 0.5 mL via INTRAMUSCULAR

## 2017-01-07 NOTE — Progress Notes (Signed)
ROB-Pt doing well, reports increased reflux. Discussed home treatment measures. Rx Zantac, see orders. Glucola and H/H today. TDaP given. Blood transfusion consent reviewed and signed. Discussed childbirth classes, infant feeding, intrapartum pain management options, delayed cord clamping vs cord blood banking, [facility] volunteer doula program, pediatrician selection, and postpartum contracpetion. Plans breastfeeding, immediate skin to skin, and delayed cord clamping. Unsure, but leaning towards PP Nexplanon. Reviewed red flag symptoms and when to call. RTC x 2 weeks for ROB or sooner if needed.

## 2017-01-07 NOTE — Patient Instructions (Addendum)
Vaginal Delivery Vaginal delivery means that you will give birth by pushing your baby out of your birth canal (vagina). A team of health care providers will help you before, during, and after vaginal delivery. Birth experiences are unique for every woman and every pregnancy, and birth experiences vary depending on where you choose to give birth. What should I do to prepare for my baby's birth? Before your baby is born, it is important to talk with your health care provider about:  Your labor and delivery preferences. These may include: ? Medicines that you may be given. ? How you will manage your pain. This might include non-medical pain relief techniques or injectable pain relief such as epidural analgesia. ? How you and your baby will be monitored during labor and delivery. ? Who may be in the labor and delivery room with you. ? Your feelings about surgical delivery of your baby (cesarean delivery, or C-section) if this becomes necessary. ? Your feelings about receiving donated blood through an IV tube (blood transfusion) if this becomes necessary.  Whether you are able: ? To take pictures or videos of the birth. ? To eat during labor and delivery. ? To move around, walk, or change positions during labor and delivery.  What to expect after your baby is born, such as: ? Whether delayed umbilical cord clamping and cutting is offered. ? Who will care for your baby right after birth. ? Medicines or tests that may be recommended for your baby. ? Whether breastfeeding is supported in your hospital or birth center. ? How long you will be in the hospital or birth center.  How any medical conditions you have may affect your baby or your labor and delivery experience.  To prepare for your baby's birth, you should also:  Attend all of your health care visits before delivery (prenatal visits) as recommended by your health care provider. This is important.  Prepare your home for your baby's  arrival. Make sure that you have: ? Diapers. ? Baby clothing. ? Feeding equipment. ? Safe sleeping arrangements for you and your baby.  Install a car seat in your vehicle. Have your car seat checked by a certified car seat installer to make sure that it is installed safely.  Think about who will help you with your new baby at home for at least the first several weeks after delivery.  What can I expect when I arrive at the birth center or hospital? Once you are in labor and have been admitted into the hospital or birth center, your health care provider may:  Review your pregnancy history and any concerns you have.  Insert an IV tube into one of your veins. This is used to give you fluids and medicines.  Check your blood pressure, pulse, temperature, and heart rate (vital signs).  Check whether your bag of water (amniotic sac) has broken (ruptured).  Talk with you about your birth plan and discuss pain control options.  Monitoring Your health care provider may monitor your contractions (uterine monitoring) and your baby's heart rate (fetal monitoring). You may need to be monitored:  Often, but not continuously (intermittently).  All the time or for long periods at a time (continuously). Continuous monitoring may be needed if: ? You are taking certain medicines, such as medicine to relieve pain or make your contractions stronger. ? You have pregnancy or labor complications.  Monitoring may be done by:  Placing a special stethoscope or a handheld monitoring device on your abdomen to   check your baby's heartbeat, and feeling your abdomen for contractions. This method of monitoring does not continuously record your baby's heartbeat or your contractions.  Placing monitors on your abdomen (external monitors) to record your baby's heartbeat and the frequency and length of contractions. You may not have to wear external monitors all the time.  Placing monitors inside of your uterus  (internal monitors) to record your baby's heartbeat and the frequency, length, and strength of your contractions. ? Your health care provider may use internal monitors if he or she needs more information about the strength of your contractions or your baby's heart rate. ? Internal monitors are put in place by passing a thin, flexible wire through your vagina and into your uterus. Depending on the type of monitor, it may remain in your uterus or on your baby's head until birth. ? Your health care provider will discuss the benefits and risks of internal monitoring with you and will ask for your permission before inserting the monitors.  Telemetry. This is a type of continuous monitoring that can be done with external or internal monitors. Instead of having to stay in bed, you are able to move around during telemetry. Ask your health care provider if telemetry is an option for you.  Physical exam Your health care provider may perform a physical exam. This may include:  Checking whether your baby is positioned: ? With the head toward your vagina (head-down). This is most common. ? With the head toward the top of your uterus (head-up or breech). If your baby is in a breech position, your health care provider may try to turn your baby to a head-down position so you can deliver vaginally. If it does not seem that your baby can be born vaginally, your provider may recommend surgery to deliver your baby. In rare cases, you may be able to deliver vaginally if your baby is head-up (breech delivery). ? Lying sideways (transverse). Babies that are lying sideways cannot be delivered vaginally.  Checking your cervix to determine: ? Whether it is thinning out (effacing). ? Whether it is opening up (dilating). ? How low your baby has moved into your birth canal.  What are the three stages of labor and delivery?  Normal labor and delivery is divided into the following three stages: Stage 1  Stage 1 is the  longest stage of labor, and it can last for hours or days. Stage 1 includes: ? Early labor. This is when contractions may be irregular, or regular and mild. Generally, early labor contractions are more than 10 minutes apart. ? Active labor. This is when contractions get longer, more regular, more frequent, and more intense. ? The transition phase. This is when contractions happen very close together, are very intense, and may last longer than during any other part of labor.  Contractions generally feel mild, infrequent, and irregular at first. They get stronger, more frequent (about every 2-3 minutes), and more regular as you progress from early labor through active labor and transition.  Many women progress through stage 1 naturally, but you may need help to continue making progress. If this happens, your health care provider may talk with you about: ? Rupturing your amniotic sac if it has not ruptured yet. ? Giving you medicine to help make your contractions stronger and more frequent.  Stage 1 ends when your cervix is completely dilated to 4 inches (10 cm) and completely effaced. This happens at the end of the transition phase. Stage 2  Once   your cervix is completely effaced and dilated to 4 inches (10 cm), you may start to feel an urge to push. It is common for the body to naturally take a rest before feeling the urge to push, especially if you received an epidural or certain other pain medicines. This rest period may last for up to 1-2 hours, depending on your unique labor experience.  During stage 2, contractions are generally less painful, because pushing helps relieve contraction pain. Instead of contraction pain, you may feel stretching and burning pain, especially when the widest part of your baby's head passes through the vaginal opening (crowning).  Your health care provider will closely monitor your pushing progress and your baby's progress through the vagina during stage 2.  Your  health care provider may massage the area of skin between your vaginal opening and anus (perineum) or apply warm compresses to your perineum. This helps it stretch as the baby's head starts to crown, which can help prevent perineal tearing. ? In some cases, an incision may be made in your perineum (episiotomy) to allow the baby to pass through the vaginal opening. An episiotomy helps to make the opening of the vagina larger to allow more room for the baby to fit through.  It is very important to breathe and focus so your health care provider can control the delivery of your baby's head. Your health care provider may have you decrease the intensity of your pushing, to help prevent perineal tearing.  After delivery of your baby's head, the shoulders and the rest of the body generally deliver very quickly and without difficulty.  Once your baby is delivered, the umbilical cord may be cut right away, or this may be delayed for 1-2 minutes, depending on your baby's health. This may vary among health care providers, hospitals, and birth centers.  If you and your baby are healthy enough, your baby may be placed on your chest or abdomen to help maintain the baby's temperature and to help you bond with each other. Some mothers and babies start breastfeeding at this time. Your health care team will dry your baby and help keep your baby warm during this time.  Your baby may need immediate care if he or she: ? Showed signs of distress during labor. ? Has a medical condition. ? Was born too early (prematurely). ? Had a bowel movement before birth (meconium). ? Shows signs of difficulty transitioning from being inside the uterus to being outside of the uterus. If you are planning to breastfeed, your health care team will help you begin a feeding. Stage 3  The third stage of labor starts immediately after the birth of your baby and ends after you deliver the placenta. The placenta is an organ that develops  during pregnancy to provide oxygen and nutrients to your baby in the womb.  Delivering the placenta may require some pushing, and you may have mild contractions. Breastfeeding can stimulate contractions to help you deliver the placenta.  After the placenta is delivered, your uterus should tighten (contract) and become firm. This helps to stop bleeding in your uterus. To help your uterus contract and to control bleeding, your health care provider may: ? Give you medicine by injection, through an IV tube, by mouth, or through your rectum (rectally). ? Massage your abdomen or perform a vaginal exam to remove any blood clots that are left in your uterus. ? Empty your bladder by placing a thin, flexible tube (catheter) into your bladder. ? Encourage   you to breastfeed your baby. After labor is over, you and your baby will be monitored closely to ensure that you are both healthy until you are ready to go home. Your health care team will teach you how to care for yourself and your baby. This information is not intended to replace advice given to you by your health care provider. Make sure you discuss any questions you have with your health care provider. Document Released: 04/06/2008 Document Revised: 01/16/2016 Document Reviewed: 07/13/2015 Elsevier Interactive Patient Education  2018 [employer]. Breastfeeding Deciding to breastfeed is one of the best choices you can make for you and your baby. A change in hormones during pregnancy causes your breast tissue to grow and increases the number and size of your milk ducts. These hormones also allow proteins, sugars, and fats from your blood supply to make breast milk in your milk-producing glands. Hormones prevent breast milk from being released before your baby is born as well as prompt milk flow after birth. Once breastfeeding has begun, thoughts of your baby, as well as his or her sucking or crying, can stimulate the release of milk from your milk-producing  glands. Benefits of breastfeeding For Your Baby  Your first milk (colostrum) helps your baby's digestive system function better.  There are antibodies in your milk that help your baby fight off infections.  Your baby has a lower incidence of asthma, allergies, and sudden infant death syndrome.  The nutrients in breast milk are better for your baby than infant formulas and are designed uniquely for your baby's needs.  Breast milk improves your baby's brain development.  Your baby is less likely to develop other conditions, such as childhood obesity, asthma, or type 2 diabetes mellitus.  For You  Breastfeeding helps to create a very special bond between you and your baby.  Breastfeeding is convenient. Breast milk is always available at the correct temperature and costs nothing.  Breastfeeding helps to burn calories and helps you lose the weight gained during pregnancy.  Breastfeeding makes your uterus contract to its prepregnancy size faster and slows bleeding (lochia) after you give birth.  Breastfeeding helps to lower your risk of developing type 2 diabetes mellitus, osteoporosis, and breast or ovarian cancer later in life.  Signs that your baby is hungry Early Signs of Hunger  Increased alertness or activity.  Stretching.  Movement of the head from side to side.  Movement of the head and opening of the mouth when the corner of the mouth or cheek is stroked (rooting).  Increased sucking sounds, smacking lips, cooing, sighing, or squeaking.  Hand-to-mouth movements.  Increased sucking of fingers or hands.  Late Signs of Hunger  Fussing.  Intermittent crying.  Extreme Signs of Hunger Signs of extreme hunger will require calming and consoling before your baby will be able to breastfeed successfully. Do not wait for the following signs of extreme hunger to occur before you initiate breastfeeding:  Restlessness.  A loud, strong cry.  Screaming.  Breastfeeding  basics Breastfeeding Initiation  Find a comfortable place to sit or lie down, with your neck and back well supported.  Place a pillow or rolled up blanket under your baby to bring him or her to the level of your breast (if you are seated). Nursing pillows are specially designed to help support your arms and your baby while you breastfeed.  Make sure that your baby's abdomen is facing your abdomen.  Gently massage your breast. With your fingertips, massage from your chest  wall toward your nipple in a circular motion. This encourages milk flow. You may need to continue this action during the feeding if your milk flows slowly.  Support your breast with 4 fingers underneath and your thumb above your nipple. Make sure your fingers are well away from your nipple and your baby's mouth.  Stroke your baby's lips gently with your finger or nipple.  When your baby's mouth is open wide enough, quickly bring your baby to your breast, placing your entire nipple and as much of the colored area around your nipple (areola) as possible into your baby's mouth. ? More areola should be visible above your baby's upper lip than below the lower lip. ? Your baby's tongue should be between his or her lower gum and your breast.  Ensure that your baby's mouth is correctly positioned around your nipple (latched). Your baby's lips should create a seal on your breast and be turned out (everted).  It is common for your baby to suck about 2-3 minutes in order to start the flow of breast milk.  Latching Teaching your baby how to latch on to your breast properly is very important. An improper latch can cause nipple pain and decreased milk supply for you and poor weight gain in your baby. Also, if your baby is not latched onto your nipple properly, he or she may swallow some air during feeding. This can make your baby fussy. Burping your baby when you switch breasts during the feeding can help to get rid of the air. However,  teaching your baby to latch on properly is still the best way to prevent fussiness from swallowing air while breastfeeding. Signs that your baby has successfully latched on to your nipple:  Silent tugging or silent sucking, without causing you pain.  Swallowing heard between every 3-4 sucks.  Muscle movement above and in front of his or her ears while sucking.  Signs that your baby has not successfully latched on to nipple:  Sucking sounds or smacking sounds from your baby while breastfeeding.  Nipple pain.  If you think your baby has not latched on correctly, slip your finger into the corner of your baby's mouth to break the suction and place it between your baby's gums. Attempt breastfeeding initiation again. Signs of Successful Breastfeeding Signs from your baby:  A gradual decrease in the number of sucks or complete cessation of sucking.  Falling asleep.  Relaxation of his or her body.  Retention of a small amount of milk in his or her mouth.  Letting go of your breast by himself or herself.  Signs from you:  Breasts that have increased in firmness, weight, and size 1-3 hours after feeding.  Breasts that are softer immediately after breastfeeding.  Increased milk volume, as well as a change in milk consistency and color by the fifth day of breastfeeding.  Nipples that are not sore, cracked, or bleeding.  Signs That Your Pecola Leisure is Getting Enough Milk  Wetting at least 1-2 diapers during the first 24 hours after birth.  Wetting at least 5-6 diapers every 24 hours for the first week after birth. The urine should be clear or pale yellow by 5 days after birth.  Wetting 6-8 diapers every 24 hours as your baby continues to grow and develop.  At least 3 stools in a 24-hour period by age 296 days. The stool should be soft and yellow.  At least 3 stools in a 24-hour period by age 29 days. The stool should  be seedy and yellow.  No loss of weight greater than 10% of birth weight  during the first 60 days of age.  Average weight gain of 4-7 ounces (113-198 g) per week after age 66 days.  Consistent daily weight gain by age 48 days, without weight loss after the age of 2 weeks.  After a feeding, your baby may spit up a small amount. This is common. Breastfeeding frequency and duration Frequent feeding will help you make more milk and can prevent sore nipples and breast engorgement. Breastfeed when you feel the need to reduce the fullness of your breasts or when your baby shows signs of hunger. This is called "breastfeeding on demand." Avoid introducing a pacifier to your baby while you are working to establish breastfeeding (the first 4-6 weeks after your baby is born). After this time you may choose to use a pacifier. Research has shown that pacifier use during the first year of a baby's life decreases the risk of sudden infant death syndrome (SIDS). Allow your baby to feed on each breast as long as he or she wants. Breastfeed until your baby is finished feeding. When your baby unlatches or falls asleep while feeding from the first breast, offer the second breast. Because newborns are often sleepy in the first few weeks of life, you may need to awaken your baby to get him or her to feed. Breastfeeding times will vary from baby to baby. However, the following rules can serve as a guide to help you ensure that your baby is properly fed:  Newborns (babies 35 weeks of age or younger) may breastfeed every 1-3 hours.  Newborns should not go longer than 3 hours during the day or 5 hours during the night without breastfeeding.  You should breastfeed your baby a minimum of 8 times in a 24-hour period until you begin to introduce solid foods to your baby at around 88 months of age.  Breast milk pumping Pumping and storing breast milk allows you to ensure that your baby is exclusively fed your breast milk, even at times when you are unable to breastfeed. This is especially important if you  are going back to work while you are still breastfeeding or when you are not able to be present during feedings. Your lactation consultant can give you guidelines on how long it is safe to store breast milk. A breast pump is a machine that allows you to pump milk from your breast into a sterile bottle. The pumped breast milk can then be stored in a refrigerator or freezer. Some breast pumps are operated by hand, while others use electricity. Ask your lactation consultant which type will work best for you. Breast pumps can be purchased, but some hospitals and breastfeeding support groups lease breast pumps on a monthly basis. A lactation consultant can teach you how to hand express breast milk, if you prefer not to use a pump. Caring for your breasts while you breastfeed Nipples can become dry, cracked, and sore while breastfeeding. The following recommendations can help keep your breasts moisturized and healthy:  Avoid using soap on your nipples.  Wear a supportive bra. Although not required, special nursing bras and tank tops are designed to allow access to your breasts for breastfeeding without taking off your entire bra or top. Avoid wearing underwire-style bras or extremely tight bras.  Air dry your nipples for 3-41minutes after each feeding.  Use only cotton bra pads to absorb leaked breast milk. Leaking of breast milk between  feedings is normal.  Use lanolin on your nipples after breastfeeding. Lanolin helps to maintain your skin's normal moisture barrier. If you use pure lanolin, you do not need to wash it off before feeding your baby again. Pure lanolin is not toxic to your baby. You may also hand express a few drops of breast milk and gently massage that milk into your nipples and allow the milk to air dry.  In the first few weeks after giving birth, some women experience extremely full breasts (engorgement). Engorgement can make your breasts feel heavy, warm, and tender to the touch.  Engorgement peaks within 3-5 days after you give birth. The following recommendations can help ease engorgement:  Completely empty your breasts while breastfeeding or pumping. You may want to start by applying warm, moist heat (in the shower or with warm water-soaked hand towels) just before feeding or pumping. This increases circulation and helps the milk flow. If your baby does not completely empty your breasts while breastfeeding, pump any extra milk after he or she is finished.  Wear a snug bra (nursing or regular) or tank top for 1-2 days to signal your body to slightly decrease milk production.  Apply ice packs to your breasts, unless this is too uncomfortable for you.  Make sure that your baby is latched on and positioned properly while breastfeeding.  If engorgement persists after 48 hours of following these recommendations, contact your health care provider or a Advertising copywriterlactation consultant. Overall health care recommendations while breastfeeding  Eat healthy foods. Alternate between meals and snacks, eating 3 of each per day. Because what you eat affects your breast milk, some of the foods may make your baby more irritable than usual. Avoid eating these foods if you are sure that they are negatively affecting your baby.  Drink milk, fruit juice, and water to satisfy your thirst (about 10 glasses a day).  Rest often, relax, and continue to take your prenatal vitamins to prevent fatigue, stress, and anemia.  Continue breast self-awareness checks.  Avoid chewing and smoking tobacco. Chemicals from cigarettes that pass into breast milk and exposure to secondhand smoke may harm your baby.  Avoid alcohol and drug use, including marijuana. Some medicines that may be harmful to your baby can pass through breast milk. It is important to ask your health care provider before taking any medicine, including all over-the-counter and prescription medicine as well as vitamin and herbal supplements. It is  possible to become pregnant while breastfeeding. If birth control is desired, ask your health care provider about options that will be safe for your baby. Contact a health care provider if:  You feel like you want to stop breastfeeding or have become frustrated with breastfeeding.  You have painful breasts or nipples.  Your nipples are cracked or bleeding.  Your breasts are red, tender, or warm.  You have a swollen area on either breast.  You have a fever or chills.  You have nausea or vomiting.  You have drainage other than breast milk from your nipples.  Your breasts do not become full before feedings by the fifth day after you give birth.  You feel sad and depressed.  Your baby is too sleepy to eat well.  Your baby is having trouble sleeping.  Your baby is wetting less than 3 diapers in a 24-hour period.  Your baby has less than 3 stools in a 24-hour period.  Your baby's skin or the white part of his or her eyes becomes yellow.  Your baby is not gaining weight by 97 days of age. Get help right away if:  Your baby is overly tired (lethargic) and does not want to wake up and feed.  Your baby develops an unexplained fever. This information is not intended to replace advice given to you by your health care provider. Make sure you discuss any questions you have with your health care provider. Document Released: 06/28/2005 Document Revised: 12/10/2015 Document Reviewed: 12/20/2012 Elsevier Interactive Patient Education  [address] Power Class  December 22, 2016  Wednesday 7:00p - 9:00p  [facility] [place], [place]  January 26, 2017  Wednesday 7:00p - 9:00p [facility] [place], [place]    March 02, 2017   Wednesday 7:00p - 9:000p [facility] [place], [place]  March 30, 2017  Wednesday  7:00p - 9:00p [facility] [place], [place]  April 27, 2017 Wednesday 7:00p -  9:00p [facility] [place], [place]  Interested in a waterbirth?  This informational class will help you discover whether waterbirth is the right fit for you.  Education about waterbirth itself, supplies you would need and how to assemble your support team is what you can expect from this class.  Some obstetrical practices require this class in order to pursue a waterbirth.  (Not all obstetrical practices offer waterbirth check with your healthcare provider)  Register only the expectant mom, but you are encouraged to bring your partner to class!  Fees & Payment No fee  Register Online [email or web address] Search [facility] 2018 Prenatal Education Class Schedule Register at [email or web address] in the Classes & Resources Link or call Mardi Mainland at [phone] 9:00a-5:00p M-F  Childbirth Preparation Certified Childbirth Educators teach this 5 week course.  Expectant parents are encouraged to take this class in their 3rd trimester, completing it by their 35-36th week. Meets in [facility], Lower Level.  Mondays Thursdays  7:00-9:00 p 7:00-9:00 p  July 23 - August 20 July 19 - August 16  September 17 - October 15 September 6 -October 4  November 5 - December 3 October 25 - November 29   No Class on Thanksgiving Day -November 22  Childbirth Preparation Refresher Course For those who have previously attended Prepared Childbirth Preparation classes, this class in incorporated into the 3rd and 4th classes in the Monday night childbirth series.  Course meets in the [facility]. Lower Level from 7:00p - 9:00p  August 6 & 13  October 1 & 8  November 19 & 26   Weekend Childbirth Aundria Mems Classes are held Saturday & Sunday, 1:00 5:00p Course meets in [facility] [place] Level  August 4 & 5  November 3 & 4    The [address] Free tours are held on the third Sunday of each month at 3 pm.  The tour  meets in the third floor waiting area and will take approximately 30 minutes.  Tours are also included in Childbirth class series as well as Brother/Sister class.  An online virtual tour can be seen at [email or web address]         Breastfeeding & Infant Nutrition The course incorporates returning to work or school.  Breast milk collection and storage with basic breastfeeding and infant nutrition. This two-class course is held the 2nd and 3rd Tuesday of each month from 7:00 -9:00 pm.  Course meets in the [facility] Lower level  June 12 & 19 July-No Class  August 14 &  21 September 11 &18  September 11 & 18 October 9 &16  November 13 & 20 December 11 & 18   [employer] welcomes any mother for a social outing with other Moms to share experiences and challenges in an informal setting.  Meets the 1st Thursday and 3rd Thursday 11:30a-1:00 pm of each month in [facility] 3rd floor classroom.  No registration required.  Newborn Essentials This course covers bathing, diapering, swaddling and more with practice on lifelike dolls.  Participants will also learn safety tips and infant CPR (Not for certification).  It is held the 1st Wednesday of each month from 7:00p-9:00p in the [facility], Lower level.  June 6 July- No Class  August 1 September 5  October 3 November 7  December 7    Preparing Big Brother & Sister This one session course prepares children and their parents for the arrival of a new baby.  It is held on the 1st Tuesday of each month from 6:30p - 8:00p. Course meets in the [facility], Lower level.  July-No Class August 7  September 4 October 2  November 6 December 4   [place] for [employer]  Dads This nationally acclaimed class helps expecting and new dads with the basic skills and confidence to bond with their infants, support their mates, and provide a safe and healthy home environment for their new family. Classes are held the 2nd Saturday  of every month from 9:00a - 12:00 noon.  Course meets in the [facility] Lower level.  June 9 August 11  October 13 No Class in December   Heartburn During Pregnancy Heartburn is a type of pain or discomfort that can happen in the throat or chest. It is often described as a burning sensation. Heartburn is common during pregnancy because:  A hormone (progesterone) that is released during pregnancy may relax the valve (lower esophageal sphincter, or LES) that separates the esophagus from the stomach. This allows stomach acid to move up into the esophagus, causing heartburn.  The uterus gets larger and pushes up on the stomach, which pushes more acid into the esophagus. This is especially true in the later stages of pregnancy.  Heartburn usually goes away or gets better after giving birth. What are the causes? Heartburn is caused by stomach acid backing up into the esophagus (reflux). Reflux can be triggered by:  Changing hormone levels.  Large meals.  Certain foods and beverages, such as coffee, chocolate, onions, and peppermint.  Exercise.  Increased stomach acid production.  What increases the risk? You are more likely to experience heartburn during pregnancy if you:  Had heartburn prior to becoming pregnant.  Have been pregnant more than once before.  Are overweight or obese.  The likelihood that you will get heartburn also increases as you get farther along in your pregnancy, especially during the last trimester. What are the signs or symptoms? Symptoms of this condition include:  Burning pain in the chest or lower throat.  Bitter taste in the mouth.  Coughing.  Problems swallowing.  Vomiting.  Hoarse voice.  Asthma.  Symptoms may get worse when you lie down or bend over. Symptoms are often worse at night. How is this diagnosed? This condition is diagnosed based on:  Your medical history.  Your symptoms.  Blood tests to check for a certain type  of bacteria associated with heartburn.  Whether taking heartburn medicine relieves your symptoms.  Examination of the stomach and esophagus using a tube  with a light and camera on the end (endoscopy).  How is this treated? Treatment varies depending on how severe your symptoms are. Your health care provider may recommend:  Over-the-counter medicines (antacids or acid reducers) for mild heartburn.  Prescription medicines to decrease stomach acid or to protect your stomach lining.  Certain changes in your diet.  Raising the head of your bed so it is higher than the foot of the bed. This helps prevent stomach acid from backing up into the esophagus when you are lying down.  Follow these instructions at home: Eating and drinking  Do not drink alcohol during your pregnancy.  Identify foods and beverages that make your symptoms worse, and avoid them.  Beverages that you may want to avoid include: ? Coffee and tea (with or without caffeine). ? Energy drinks and sports drinks. ? Carbonated drinks or sodas. ? Citrus fruit juices.  Foods that you may want to avoid include: ? Chocolate and cocoa. ? Peppermint and mint flavorings. ? Garlic, onions, and horseradish. ? Spicy and acidic foods, including peppers, chili powder, curry powder, vinegar, hot sauces, and barbecue sauce. ? Citrus fruits, such as oranges, lemons, and limes. ? Tomato-based foods, such as red sauce, chili, and salsa. ? Fried and fatty foods, such as donuts, french fries, potato chips, and high-fat dressings. ? High-fat meats, such as hot dogs, cold cuts, sausage, ham, and bacon. ? High-fat dairy items, such as whole milk, butter, and cheese.  Eat small, frequent meals instead of large meals.  Avoid drinking large amounts of liquid with your meals.  Avoid eating meals during the 2-3 hours before bedtime.  Avoid lying down right after you eat.  Do not exercise right after you eat. Medicines  Take  over-the-counter and prescription medicines only as told by your health care provider.  Do not take aspirin, ibuprofen, or other NSAIDs unless your health care provider tells you to do that.  You may be instructed to avoid medicines that contain sodium bicarbonate. General instructions  If directed, raise the head of your bed about 6 inches (15 cm) by putting blocks under the legs. Sleeping with more pillows does not effectively relieve heartburn because it only changes the position of your head.  Do not use any products that contain nicotine or tobacco, such as cigarettes and e-cigarettes. If you need help quitting, ask your health care provider.  Wear loose-fitting clothing.  Try to reduce your stress, such as with yoga or meditation. If you need help managing stress, ask your health care provider.  Maintain a healthy weight. If you are overweight, work with your health care provider to safely lose weight.  Keep all follow-up visits as told by your health care provider. This is important. Contact a health care provider if:  You develop new symptoms.  Your symptoms do not improve with treatment.  You have unexplained weight loss.  You have difficulty swallowing.  You make loud sounds when you breathe (wheeze).  You have a cough that does not go away.  You have frequent heartburn for more than 2 weeks.  You have nausea or vomiting that does not get better with treatment.  You have pain in your abdomen. Get help right away if:  You have severe chest pain that spreads to your arm, neck, or jaw.  You feel sweaty, dizzy, or light-headed.  You have shortness of breath.  You have pain when swallowing.  You vomit, and your vomit looks like blood or coffee grounds.  Your stool is bloody or black. This information is not intended to replace advice given to you by your health care provider. Make sure you discuss any questions you have with your health care provider. Document  Released: 06/25/2000 Document Revised: 03/15/2016 Document Reviewed: 03/15/2016 Elsevier Interactive Patient Education  2018 [employer].

## 2017-01-08 LAB — CBC WITH DIFFERENTIAL/PLATELET
Basophils Absolute: 0 10*3/uL (ref 0.0–0.2)
Basos: 0 %
EOS (ABSOLUTE): 0.3 10*3/uL (ref 0.0–0.4)
EOS: 3 %
HEMATOCRIT: 32 % — AB (ref 34.0–46.6)
HEMOGLOBIN: 10.7 g/dL — AB (ref 11.1–15.9)
Immature Grans (Abs): 0.2 10*3/uL — ABNORMAL HIGH (ref 0.0–0.1)
Immature Granulocytes: 2 %
LYMPHS ABS: 1.3 10*3/uL (ref 0.7–3.1)
Lymphs: 13 %
MCH: 30.7 pg (ref 26.6–33.0)
MCHC: 33.4 g/dL (ref 31.5–35.7)
MCV: 92 fL (ref 79–97)
MONOS ABS: 0.5 10*3/uL (ref 0.1–0.9)
Monocytes: 5 %
NEUTROS ABS: 7.6 10*3/uL — AB (ref 1.4–7.0)
Neutrophils: 77 %
Platelets: 186 10*3/uL (ref 150–379)
RBC: 3.49 x10E6/uL — AB (ref 3.77–5.28)
RDW: 13.7 % (ref 12.3–15.4)
WBC: 10 10*3/uL (ref 3.4–10.8)

## 2017-01-08 LAB — GLUCOSE, 1 HOUR GESTATIONAL: Gestational Diabetes Screen: 99 mg/dL (ref 65–139)

## 2017-01-25 ENCOUNTER — Ambulatory Visit (INDEPENDENT_AMBULATORY_CARE_PROVIDER_SITE_OTHER): Payer: BLUE CROSS/BLUE SHIELD | Admitting: Obstetrics and Gynecology

## 2017-01-25 VITALS — BP 110/69 | HR 95 | Wt 129.2 lb

## 2017-01-25 DIAGNOSIS — Z3493 Encounter for supervision of normal pregnancy, unspecified, third trimester: Secondary | ICD-10-CM

## 2017-01-25 LAB — POCT URINALYSIS DIPSTICK
Bilirubin, UA: NEGATIVE
Glucose, UA: NEGATIVE
Ketones, UA: NEGATIVE
Leukocytes, UA: NEGATIVE
Nitrite, UA: NEGATIVE
PH UA: 6 (ref 5.0–8.0)
PROTEIN UA: NEGATIVE
RBC UA: NEGATIVE
SPEC GRAV UA: 1.01 (ref 1.010–1.025)
UROBILINOGEN UA: 0.2 U/dL

## 2017-01-25 NOTE — Patient Instructions (Signed)
Third Trimester of Pregnancy The third trimester is from week 28 through week 40 (months 7 through 9). The third trimester is a time when the unborn baby (fetus) is growing rapidly. At the end of the ninth month, the fetus is about 20 inches in length and weighs 6-10 pounds. Body changes during your third trimester Your body will continue to go through many changes during pregnancy. The changes vary from woman to woman. During the third trimester:  Your weight will continue to increase. You can expect to gain 25-35 pounds (11-16 kg) by the end of the pregnancy.  You may begin to get stretch marks on your hips, abdomen, and breasts.  You may urinate more often because the fetus is moving lower into your pelvis and pressing on your bladder.  You may develop or continue to have heartburn. This is caused by increased hormones that slow down muscles in the digestive tract.  You may develop or continue to have constipation because increased hormones slow digestion and cause the muscles that push waste through your intestines to relax.  You may develop hemorrhoids. These are swollen veins (varicose veins) in the rectum that can itch or be painful.  You may develop swollen, bulging veins (varicose veins) in your legs.  You may have increased body aches in the pelvis, back, or thighs. This is due to weight gain and increased hormones that are relaxing your joints.  You may have changes in your hair. These can include thickening of your hair, rapid growth, and changes in texture. Some women also have hair loss during or after pregnancy, or hair that feels dry or thin. Your hair will most likely return to normal after your baby is born.  Your breasts will continue to grow and they will continue to become tender. A yellow fluid (colostrum) may leak from your breasts. This is the first milk you are producing for your baby.  Your belly button may stick out.  You may notice more swelling in your hands,  face, or ankles.  You may have increased tingling or numbness in your hands, arms, and legs. The skin on your belly may also feel numb.  You may feel short of breath because of your expanding uterus.  You may have more problems sleeping. This can be caused by the size of your belly, increased need to urinate, and an increase in your body's metabolism.  You may notice the fetus "dropping," or moving lower in your abdomen (lightening).  You may have increased vaginal discharge.  You may notice your joints feel loose and you may have pain around your pelvic bone.  What to expect at prenatal visits You will have prenatal exams every 2 weeks until week 36. Then you will have weekly prenatal exams. During a routine prenatal visit:  You will be weighed to make sure you and the baby are growing normally.  Your blood pressure will be taken.  Your abdomen will be measured to track your baby's growth.  The fetal heartbeat will be listened to.  Any test results from the previous visit will be discussed.  You may have a cervical check near your due date to see if your cervix has softened or thinned (effaced).  You will be tested for Group B streptococcus. This happens between 35 and 37 weeks.  Your health care provider may ask you:  What your birth plan is.  How you are feeling.  If you are feeling the baby move.  If you have had   any abnormal symptoms, such as leaking fluid, bleeding, severe headaches, or abdominal cramping.  If you are using any tobacco products, including cigarettes, chewing tobacco, and electronic cigarettes.  If you have any questions.  Other tests or screenings that may be performed during your third trimester include:  Blood tests that check for low iron levels (anemia).  Fetal testing to check the health, activity level, and growth of the fetus. Testing is done if you have certain medical conditions or if there are problems during the  pregnancy.  Nonstress test (NST). This test checks the health of your baby to make sure there are no signs of problems, such as the baby not getting enough oxygen. During this test, a belt is placed around your belly. The baby is made to move, and its heart rate is monitored during movement.  What is false labor? False labor is a condition in which you feel small, irregular tightenings of the muscles in the womb (contractions) that usually go away with rest, changing position, or drinking water. These are called Braxton Hicks contractions. Contractions may last for hours, days, or even weeks before true labor sets in. If contractions come at regular intervals, become more frequent, increase in intensity, or become painful, you should see your health care provider. What are the signs of labor?  Abdominal cramps.  Regular contractions that start at 10 minutes apart and become stronger and more frequent with time.  Contractions that start on the top of the uterus and spread down to the lower abdomen and back.  Increased pelvic pressure and dull back pain.  A watery or bloody mucus discharge that comes from the vagina.  Leaking of amniotic fluid. This is also known as your "water breaking." It could be a slow trickle or a gush. Let your health care provider know if it has a color or strange odor. If you have any of these signs, call your health care provider right away, even if it is before your due date. Follow these instructions at home: Medicines  Follow your health care provider's instructions regarding medicine use. Specific medicines may be either safe or unsafe to take during pregnancy.  Take a prenatal vitamin that contains at least 600 micrograms (mcg) of folic acid.  If you develop constipation, try taking a stool softener if your health care provider approves. Eating and drinking  Eat a balanced diet that includes fresh fruits and vegetables, whole grains, good sources of protein  such as meat, eggs, or tofu, and low-fat dairy. Your health care provider will help you determine the amount of weight gain that is right for you.  Avoid raw meat and uncooked cheese. These carry germs that can cause birth defects in the baby.  If you have low calcium intake from food, talk to your health care provider about whether you should take a daily calcium supplement.  Eat four or five small meals rather than three large meals a day.  Limit foods that are high in fat and processed sugars, such as fried and sweet foods.  To prevent constipation: ? Drink enough fluid to keep your urine clear or pale yellow. ? Eat foods that are high in fiber, such as fresh fruits and vegetables, whole grains, and beans. Activity  Exercise only as directed by your health care provider. Most women can continue their usual exercise routine during pregnancy. Try to exercise for 30 minutes at least 5 days a week. Stop exercising if you experience uterine contractions.  Avoid heavy   lifting.  Do not exercise in extreme heat or humidity, or at high altitudes.  Wear low-heel, comfortable shoes.  Practice good posture.  You may continue to have sex unless your health care provider tells you otherwise. Relieving pain and discomfort  Take frequent breaks and rest with your legs elevated if you have leg cramps or low back pain.  Take warm sitz baths to soothe any pain or discomfort caused by hemorrhoids. Use hemorrhoid cream if your health care provider approves.  Wear a good support bra to prevent discomfort from breast tenderness.  If you develop varicose veins: ? Wear support pantyhose or compression stockings as told by your healthcare provider. ? Elevate your feet for 15 minutes, 3-4 times a day. Prenatal care  Write down your questions. Take them to your prenatal visits.  Keep all your prenatal visits as told by your health care provider. This is important. Safety  Wear your seat belt at  all times when driving.  Make a list of emergency phone numbers, including numbers for family, friends, the hospital, and police and fire departments. General instructions  Avoid cat litter boxes and soil used by cats. These carry germs that can cause birth defects in the baby. If you have a cat, ask someone to clean the litter box for you.  Do not travel far distances unless it is absolutely necessary and only with the approval of your health care provider.  Do not use hot tubs, steam rooms, or saunas.  Do not drink alcohol.  Do not use any products that contain nicotine or tobacco, such as cigarettes and e-cigarettes. If you need help quitting, ask your health care provider.  Do not use any medicinal herbs or unprescribed drugs. These chemicals affect the formation and growth of the baby.  Do not douche or use tampons or scented sanitary pads.  Do not cross your legs for long periods of time.  To prepare for the arrival of your baby: ? Take prenatal classes to understand, practice, and ask questions about labor and delivery. ? Make a trial run to the hospital. ? Visit the hospital and tour the maternity area. ? Arrange for maternity or paternity leave through employers. ? Arrange for family and friends to take care of pets while you are in the hospital. ? Purchase a rear-facing car seat and make sure you know how to install it in your car. ? Pack your hospital bag. ? Prepare the baby's nursery. Make sure to remove all pillows and stuffed animals from the baby's crib to prevent suffocation.  Visit your dentist if you have not gone during your pregnancy. Use a soft toothbrush to brush your teeth and be gentle when you floss. Contact a health care provider if:  You are unsure if you are in labor or if your water has broken.  You become dizzy.  You have mild pelvic cramps, pelvic pressure, or nagging pain in your abdominal area.  You have lower back pain.  You have persistent  nausea, vomiting, or diarrhea.  You have an unusual or bad smelling vaginal discharge.  You have pain when you urinate. Get help right away if:  Your water breaks before 37 weeks.  You have regular contractions less than 5 minutes apart before 37 weeks.  You have a fever.  You are leaking fluid from your vagina.  You have spotting or bleeding from your vagina.  You have severe abdominal pain or cramping.  You have rapid weight loss or weight gain.    You have shortness of breath with chest pain.  You notice sudden or extreme swelling of your face, hands, ankles, feet, or legs.  Your baby makes fewer than 10 movements in 2 hours.  You have severe headaches that do not go away when you take medicine.  You have vision changes. Summary  The third trimester is from week 28 through week 40, months 7 through 9. The third trimester is a time when the unborn baby (fetus) is growing rapidly.  During the third trimester, your discomfort may increase as you and your baby continue to gain weight. You may have abdominal, leg, and back pain, sleeping problems, and an increased need to urinate.  During the third trimester your breasts will keep growing and they will continue to become tender. A yellow fluid (colostrum) may leak from your breasts. This is the first milk you are producing for your baby.  False labor is a condition in which you feel small, irregular tightenings of the muscles in the womb (contractions) that eventually go away. These are called Braxton Hicks contractions. Contractions may last for hours, days, or even weeks before true labor sets in.  Signs of labor can include: abdominal cramps; regular contractions that start at 10 minutes apart and become stronger and more frequent with time; watery or bloody mucus discharge that comes from the vagina; increased pelvic pressure and dull back pain; and leaking of amniotic fluid. This information is not intended to replace advice  given to you by your health care provider. Make sure you discuss any questions you have with your health care provider. Document Released: 06/22/2001 Document Revised: 12/04/2015 Document Reviewed: 08/29/2012 Elsevier Interactive Patient Education  2017 [employer]vier Inc.  

## 2017-01-25 NOTE — Progress Notes (Signed)
ROB-discussed enrolling in classes.

## 2017-01-25 NOTE — Addendum Note (Signed)
Addended by: Rosine BeatLONTZ, Veronica Guerrant L on: 01/25/2017 03:57 PM   Modules accepted: Orders

## 2017-01-25 NOTE — Progress Notes (Signed)
ROB- pt is doing well, denies any complaints 

## 2017-02-01 LAB — MONITOR DRUG PROFILE 14(MW)
Amphetamine Scrn, Ur: NEGATIVE ng/mL
BARBITURATE SCREEN URINE: NEGATIVE ng/mL
BENZODIAZEPINE SCREEN, URINE: NEGATIVE ng/mL
BUPRENORPHINE, URINE: NEGATIVE ng/mL
COCAINE(METAB.)SCREEN, URINE: NEGATIVE ng/mL
CREATININE(CRT), U: 98.8 mg/dL (ref 20.0–300.0)
FENTANYL, URINE: NEGATIVE pg/mL
MEPERIDINE SCREEN, URINE: NEGATIVE ng/mL
METHADONE SCREEN, URINE: NEGATIVE ng/mL
OPIATE SCREEN URINE: NEGATIVE ng/mL
OXYCODONE+OXYMORPHONE UR QL SCN: NEGATIVE ng/mL
PHENCYCLIDINE QUANTITATIVE URINE: NEGATIVE ng/mL
PROPOXYPHENE SCREEN URINE: NEGATIVE ng/mL
Ph of Urine: 6.3 (ref 4.5–8.9)
SPECIFIC GRAVITY: 1.017
TRAMADOL SCREEN, URINE: NEGATIVE ng/mL

## 2017-02-01 LAB — CANNABINOID (GC/MS), URINE
Cannabinoid: POSITIVE — AB
Carboxy THC (GC/MS): 27 ng/mL

## 2017-02-08 ENCOUNTER — Ambulatory Visit (INDEPENDENT_AMBULATORY_CARE_PROVIDER_SITE_OTHER): Payer: BLUE CROSS/BLUE SHIELD | Admitting: Certified Nurse Midwife

## 2017-02-08 VITALS — BP 95/56 | HR 85 | Wt 130.1 lb

## 2017-02-08 DIAGNOSIS — Z3493 Encounter for supervision of normal pregnancy, unspecified, third trimester: Secondary | ICD-10-CM

## 2017-02-08 LAB — POCT URINALYSIS DIPSTICK
Bilirubin, UA: NEGATIVE
Blood, UA: NEGATIVE
Glucose, UA: NEGATIVE
Ketones, UA: NEGATIVE
LEUKOCYTES UA: NEGATIVE
NITRITE UA: NEGATIVE
PH UA: 6 (ref 5.0–8.0)
Spec Grav, UA: 1.02 (ref 1.010–1.025)
Urobilinogen, UA: 0.2 E.U./dL

## 2017-02-08 NOTE — Progress Notes (Signed)
ROB, doing well. Has some back pain occasional . Uses belly band at work , encouraged more frequent use & tylenol prn for back pain. Discussed remainder of prenatal care, GBS testing @ 36 wks. Encouraged her to begin to think about her birth plan. Follow up in 2 wks for ROB.   Doreene BurkeAnnie Corra Kaine, CNM

## 2017-02-08 NOTE — Patient Instructions (Signed)
Braxton Hicks Contractions °Contractions of the uterus can occur throughout pregnancy, but they are not always a sign that you are in labor. You may have practice contractions called Braxton Hicks contractions. These false labor contractions are sometimes confused with true labor. °What are Braxton Hicks contractions? °Braxton Hicks contractions are tightening movements that occur in the muscles of the uterus before labor. Unlike true labor contractions, these contractions do not result in opening (dilation) and thinning of the cervix. Toward the end of pregnancy (32-34 weeks), Braxton Hicks contractions can happen more often and may become stronger. These contractions are sometimes difficult to tell apart from true labor because they can be very uncomfortable. You should not feel embarrassed if you go to the hospital with false labor. °Sometimes, the only way to tell if you are in true labor is for your health care provider to look for changes in the cervix. The health care provider will do a physical exam and may monitor your contractions. If you are not in true labor, the exam should show that your cervix is not dilating and your water has not broken. °If there are no prenatal problems or other health problems associated with your pregnancy, it is completely safe for you to be sent home with false labor. You may continue to have Braxton Hicks contractions until you go into true labor. °How can I tell the difference between true labor and false labor? °· Differences °? False labor °? Contractions last 30-70 seconds.: Contractions are usually shorter and not as strong as true labor contractions. °? Contractions become very regular.: Contractions are usually irregular. °? Discomfort is usually felt in the top of the uterus, and it spreads to the lower abdomen and low back.: Contractions are often felt in the front of the lower abdomen and in the groin. °? Contractions do not go away with walking.: Contractions may  go away when you walk around or change positions while lying down. °? Contractions usually become more intense and increase in frequency.: Contractions get weaker and are shorter-lasting as time goes on. °? The cervix dilates and gets thinner.: The cervix usually does not dilate or become thin. °Follow these instructions at home: °· Take over-the-counter and prescription medicines only as told by your health care provider. °· Keep up with your usual exercises and follow other instructions from your health care provider. °· Eat and drink lightly if you think you are going into labor. °· If Braxton Hicks contractions are making you uncomfortable: °? Change your position from lying down or resting to walking, or change from walking to resting. °? Sit and rest in a tub of warm water. °? Drink enough fluid to keep your urine clear or pale yellow. Dehydration may cause these contractions. °? Do slow and deep breathing several times an hour. °· Keep all follow-up prenatal visits as told by your health care provider. This is important. °Contact a health care provider if: °· You have a fever. °· You have continuous pain in your abdomen. °Get help right away if: °· Your contractions become stronger, more regular, and closer together. °· You have fluid leaking or gushing from your vagina. °· You pass blood-tinged mucus (bloody show). °· You have bleeding from your vagina. °· You have low back pain that you never had before. °· You feel your baby’s head pushing down and causing pelvic pressure. °· Your baby is not moving inside you as much as it used to. °Summary °· Contractions that occur before labor are   called Braxton Hicks contractions, false labor, or practice contractions. °· Braxton Hicks contractions are usually shorter, weaker, farther apart, and less regular than true labor contractions. True labor contractions usually become progressively stronger and regular and they become more frequent. °· Manage discomfort from  Braxton Hicks contractions by changing position, resting in a warm bath, drinking plenty of water, or practicing deep breathing. °This information is not intended to replace advice given to you by your health care provider. Make sure you discuss any questions you have with your health care provider. °Document Released: 06/28/2005 Document Revised: 05/17/2016 Document Reviewed: 05/17/2016 °Elsevier Interactive Patient Education © 2017 [employer] ° ° °How a Baby Grows During Pregnancy °Pregnancy begins when a female's sperm enters a female's egg (fertilization). This happens in one of the tubes (fallopian tubes) that connect the ovaries to the womb (uterus). The fertilized egg is called an embryo until it reaches 10 weeks. From 10 weeks until birth, it is called a fetus. The fertilized egg moves down the fallopian tube to the uterus. Then it implants into the lining of the uterus and begins to grow. °The developing fetus receives oxygen and nutrients through the pregnant woman's bloodstream and the tissues that grow (placenta) to support the fetus. The placenta is the life support system for the fetus. It provides nutrition and removes waste. °Learning as much as you can about your pregnancy and how your baby is developing can help you enjoy the experience. It can also make you aware of when there might be a problem and when to ask questions. °How long does a typical pregnancy last? °A pregnancy usually lasts 280 days, or about 40 weeks. Pregnancy is divided into three trimesters: °· First trimester: 0-13 weeks. °· Second trimester: 14-27 weeks. °· Third trimester: 28-40 weeks. ° °The day when your baby is considered ready to be born (full term) is your estimated date of delivery. °How does my baby develop month by month? °First month °· The fertilized egg attaches to the inside of the uterus. °· Some cells will form the placenta. Others will form the fetus. °· The arms, legs, brain, spinal cord, lungs, and heart  begin to develop. °· At the end of the first month, the heart begins to beat. ° °Second month °· The bones, inner ear, eyelids, hands, and feet form. °· The genitals develop. °· By the end of 8 weeks, all major organs are developing. ° °Third month °· All of the internal organs are forming. °· Teeth develop below the gums. °· Bones and muscles begin to grow. The spine can flex. °· The skin is transparent. °· Fingernails and toenails begin to form. °· Arms and legs continue to grow longer, and hands and feet develop. °· The fetus is about 3 in (7.6 cm) long. ° °Fourth month °· The placenta is completely formed. °· The external sex organs, neck, outer ear, eyebrows, eyelids, and fingernails are formed. °· The fetus can hear, swallow, and move its arms and legs. °· The kidneys begin to produce urine. °· The skin is covered with a white waxy coating (vernix) and very fine hair (lanugo). ° °Fifth month °· The fetus moves around more and can be felt for the first time (quickening). °· The fetus starts to sleep and wake up and may begin to suck its finger. °· The nails grow to the end of the fingers. °· The organ in the digestive system that makes bile (gallbladder) functions and helps to digest the nutrients. °·   If your baby is a girl, eggs are present in her ovaries. If your baby is a boy, testicles start to move down into his scrotum. ° °Sixth month °· The lungs are formed, but the fetus is not yet able to breathe. °· The eyes open. The brain continues to develop. °· Your baby has fingerprints and toe prints. Your baby's hair grows thicker. °· At the end of the second trimester, the fetus is about 9 in (22.9 cm) long. ° °Seventh month °· The fetus kicks and stretches. °· The eyes are developed enough to sense changes in light. °· The hands can make a grasping motion. °· The fetus responds to sound. ° °Eighth month °· All organs and body systems are fully developed and functioning. °· Bones harden and taste buds develop.  The fetus may hiccup. °· Certain areas of the brain are still developing. The skull remains soft. ° °Ninth month °· The fetus gains about ½ lb (0.23 kg) each week. °· The lungs are fully developed. °· Patterns of sleep develop. °· The fetus's head typically moves into a head-down position (vertex) in the uterus to prepare for birth. If the buttocks move into a vertex position instead, the baby is breech. °· The fetus weighs 6-9 lbs (2.72-4.08 kg) and is 19-20 in (48.26-50.8 cm) long. ° °What can I do to have a healthy pregnancy and help my baby develop? °Eating and Drinking °· Eat a healthy diet. °? Talk with your health care provider to make sure that you are getting the nutrients that you and your baby need. °? Visit www.choosemyplate.gov to learn about creating a healthy diet. °· Gain a healthy amount of weight during pregnancy as advised by your health care provider. This is usually 25-35 pounds. You may need to: °? Gain more if you were underweight before getting pregnant or if you are pregnant with more than one baby. °? Gain less if you were overweight or obese when you got pregnant. ° °Medicines and Vitamins °· Take prenatal vitamins as directed by your health care provider. These include vitamins such as folic acid, iron, calcium, and vitamin D. They are important for healthy development. °· Take medicines only as directed by your health care provider. Read labels and ask a pharmacist or your health care provider whether over-the-counter medicines, supplements, and prescription drugs are safe to take during pregnancy. ° °Activities °· Be physically active as advised by your health care provider. Ask your health care provider to recommend activities that are safe for you to do, such as walking or swimming. °· Do not participate in strenuous or extreme sports. ° °Lifestyle °· Do not drink alcohol. °· Do not use any tobacco products, including cigarettes, chewing tobacco, or electronic cigarettes. If you need  help quitting, ask your health care provider. °· Do not use illegal drugs. ° °Safety °· Avoid exposure to mercury, lead, or other heavy metals. Ask your health care provider about common sources of these heavy metals. °· Avoid listeria infection during pregnancy. Follow these precautions: °? Do not eat soft cheeses or deli meats. °? Do not eat hot dogs unless they have been warmed up to the point of steaming, such as in the microwave oven. °? Do not drink unpasteurized milk. °· Avoid toxoplasmosis infection during pregnancy. Follow these precautions: °? Do not change your cat's litter box, if you have a cat. Ask someone else to do this for you. °? Wear gardening gloves while working in the yard. ° °General Instructions °·   Keep all follow-up visits as directed by your health care provider. This is important. This includes prenatal care and screening tests. °· Manage any chronic health conditions. Work closely with your health care provider to keep conditions, such as diabetes, under control. ° °How do I know if my baby is developing well? °At each prenatal visit, your health care provider will do several different tests to check on your health and keep track of your baby’s development. These include: °· Fundal height. °? Your health care provider will measure your growing belly from top to bottom using a tape measure. °? Your health care provider will also feel your belly to determine your baby's position. °· Heartbeat. °? An ultrasound in the first trimester can confirm pregnancy and show a heartbeat, depending on how far along you are. °? Your health care provider will check your baby's heart rate at every prenatal visit. °? As you get closer to your delivery date, you may have regular fetal heart rate monitoring to make sure that your baby is not in distress. °· Second trimester ultrasound. °? This ultrasound checks your baby's development. It also indicates your baby’s gender. ° °What should I do if I have  concerns about my baby's development? °Always talk with your health care provider about any concerns that you may have. °This information is not intended to replace advice given to you by your health care provider. Make sure you discuss any questions you have with your health care provider. °Document Released: 12/15/2007 Document Revised: 12/04/2015 Document Reviewed: 12/05/2013 °Elsevier Interactive Patient Education © 2018 [employer] Inc. ° °

## 2017-02-22 ENCOUNTER — Encounter: Payer: Self-pay | Admitting: Certified Nurse Midwife

## 2017-02-22 ENCOUNTER — Ambulatory Visit (INDEPENDENT_AMBULATORY_CARE_PROVIDER_SITE_OTHER): Payer: BLUE CROSS/BLUE SHIELD | Admitting: Certified Nurse Midwife

## 2017-02-22 VITALS — BP 106/68 | HR 87 | Wt 135.9 lb

## 2017-02-22 DIAGNOSIS — Z3493 Encounter for supervision of normal pregnancy, unspecified, third trimester: Secondary | ICD-10-CM

## 2017-02-22 LAB — POCT URINALYSIS DIPSTICK
Bilirubin, UA: NEGATIVE
Blood, UA: NEGATIVE
GLUCOSE UA: NEGATIVE
KETONES UA: NEGATIVE
Leukocytes, UA: NEGATIVE
Nitrite, UA: NEGATIVE
Protein, UA: NEGATIVE
SPEC GRAV UA: 1.01 (ref 1.010–1.025)
Urobilinogen, UA: 0.2 E.U./dL
pH, UA: 6.5 (ref 5.0–8.0)

## 2017-02-22 NOTE — Patient Instructions (Addendum)
Round Ligament Pain The round ligament is a cord of muscle and tissue that helps to support the uterus. It can become a source of pain during pregnancy if it becomes stretched or twisted as the baby grows. The pain usually begins in the second trimester of pregnancy, and it can come and go until the baby is delivered. It is not a serious problem, and it does not cause harm to the baby. Round ligament pain is usually a short, sharp, and pinching pain, but it can also be a dull, lingering, and aching pain. The pain is felt in the lower side of the abdomen or in the groin. It usually starts deep in the groin and moves up to the outside of the hip area. Pain can occur with:  A sudden change in position.  Rolling over in bed.  Coughing or sneezing.  Physical activity.  Follow these instructions at home: Watch your condition for any changes. Take these steps to help with your pain:  When the pain starts, relax. Then try: ? Sitting down. ? Flexing your knees up to your abdomen. ? Lying on your side with one pillow under your abdomen and another pillow between your legs. ? Sitting in a warm bath for 15-20 minutes or until the pain goes away.  Take over-the-counter and prescription medicines only as told by your health care provider.  Move slowly when you sit and stand.  Avoid long walks if they cause pain.  Stop or lessen your physical activities if they cause pain.  Contact a health care provider if:  Your pain does not go away with treatment.  You feel pain in your back that you did not have before.  Your medicine is not helping. Get help right away if:  You develop a fever or chills.  You develop uterine contractions.  You develop vaginal bleeding.  You develop nausea or vomiting.  You develop diarrhea.  You have pain when you urinate. This information is not intended to replace advice given to you by your health care provider. Make sure you discuss any questions you have  with your health care provider. Document Released: 04/06/2008 Document Revised: 12/04/2015 Document Reviewed: 09/04/2014 Elsevier Interactive Patient Education  2018 [employer]  Carpal Tunnel Syndrome Carpal tunnel syndrome is a condition that causes pain in your hand and arm. The carpal tunnel is a narrow area located on the palm side of your wrist. Repeated wrist motion or certain diseases may cause swelling within the tunnel. This swelling pinches the main nerve in the wrist (median nerve). What are the causes? This condition may be caused by:  Repeated wrist motions.  Wrist injuries.  Arthritis.  A cyst or tumor in the carpal tunnel.  Fluid buildup during pregnancy.  Sometimes the cause of this condition is not known. What increases the risk? This condition is more likely to develop in:  People who have jobs that cause them to repeatedly move their wrists in the same motion, such as Health visitorbutchers and cashiers.  Women.  People with certain conditions, such as: ? Diabetes. ? Obesity. ? An underactive thyroid (hypothyroidism). ? Kidney failure.  What are the signs or symptoms? Symptoms of this condition include:  A tingling feeling in your fingers, especially in your thumb, index, and middle fingers.  Tingling or numbness in your hand.  An aching feeling in your entire arm, especially when your wrist and elbow are bent for long periods of time.  Wrist pain that goes up your arm  to your shoulder.  Pain that goes down into your palm or fingers.  A weak feeling in your hands. You may have trouble grabbing and holding items.  Your symptoms may feel worse during the night. How is this diagnosed? This condition is diagnosed with a medical history and physical exam. You may also have tests, including:  An electromyogram (EMG). This test measures electrical signals sent by your nerves into the muscles.  X-rays.  How is this treated? Treatment for this condition  includes:  Lifestyle changes. It is important to stop doing or modify the activity that caused your condition.  Physical or occupational therapy.  Medicines for pain and inflammation. This may include medicine that is injected into your wrist.  A wrist splint.  Surgery.  Follow these instructions at home: If you have a splint:  Wear it as told by your health care provider. Remove it only as told by your health care provider.  Loosen the splint if your fingers become numb and tingle, or if they turn cold and blue.  Keep the splint clean and dry. General instructions  Take over-the-counter and prescription medicines only as told by your health care provider.  Rest your wrist from any activity that may be causing your pain. If your condition is work related, talk to your employer about changes that can be made, such as getting a wrist pad to use while typing.  If directed, apply ice to the painful area: ? Put ice in a plastic bag. ? Place a towel between your skin and the bag. ? Leave the ice on for 20 minutes, 2-3 times per day.  Keep all follow-up visits as told by your health care provider. This is important.  Do any exercises as told by your health care provider, physical therapist, or occupational therapist. Contact a health care provider if:  You have new symptoms.  Your pain is not controlled with medicines.  Your symptoms get worse. This information is not intended to replace advice given to you by your health care provider. Make sure you discuss any questions you have with your health care provider. Document Released: 06/25/2000 Document Revised: 11/06/2015 Document Reviewed: 11/13/2014 Elsevier Interactive Patient Education  2017 [employer].

## 2017-02-22 NOTE — Progress Notes (Signed)
ROB-Pt doing well, reports carpal tunnel syndrome symptoms in right hand. Discussed home treatment measures including night time bracing Anticipatory guidance regarding 36 week cultures and course of prenatal care. Reviewed red flag symptoms and when to call. RTC x 2-3 weeks for 36 week cultures and ROB.

## 2017-03-15 ENCOUNTER — Encounter: Payer: BLUE CROSS/BLUE SHIELD | Admitting: Obstetrics and Gynecology

## 2017-03-18 ENCOUNTER — Ambulatory Visit (INDEPENDENT_AMBULATORY_CARE_PROVIDER_SITE_OTHER): Payer: BLUE CROSS/BLUE SHIELD | Admitting: Obstetrics and Gynecology

## 2017-03-18 VITALS — BP 103/71 | HR 100 | Wt 139.4 lb

## 2017-03-18 DIAGNOSIS — Z3685 Encounter for antenatal screening for Streptococcus B: Secondary | ICD-10-CM

## 2017-03-18 DIAGNOSIS — Z3493 Encounter for supervision of normal pregnancy, unspecified, third trimester: Secondary | ICD-10-CM

## 2017-03-18 DIAGNOSIS — Z113 Encounter for screening for infections with a predominantly sexual mode of transmission: Secondary | ICD-10-CM

## 2017-03-18 LAB — POCT URINALYSIS DIPSTICK
BILIRUBIN UA: NEGATIVE
Blood, UA: NEGATIVE
GLUCOSE UA: NEGATIVE
KETONES UA: NEGATIVE
Leukocytes, UA: NEGATIVE
Nitrite, UA: NEGATIVE
Protein, UA: NEGATIVE
SPEC GRAV UA: 1.01 (ref 1.010–1.025)
Urobilinogen, UA: 0.2 E.U./dL
pH, UA: 7 (ref 5.0–8.0)

## 2017-03-18 NOTE — Progress Notes (Signed)
Rob & CULTURES-labor precautions and pain management discussed.

## 2017-03-18 NOTE — Progress Notes (Signed)
ROB- cultures obtained, pt is having some pelvic pressure 

## 2017-03-20 LAB — STREP GP B NAA: STREP GROUP B AG: NEGATIVE

## 2017-03-21 LAB — GC/CHLAMYDIA PROBE AMP
Chlamydia trachomatis, NAA: NEGATIVE
Neisseria gonorrhoeae by PCR: NEGATIVE

## 2017-03-24 ENCOUNTER — Ambulatory Visit (INDEPENDENT_AMBULATORY_CARE_PROVIDER_SITE_OTHER): Payer: BLUE CROSS/BLUE SHIELD | Admitting: Certified Nurse Midwife

## 2017-03-24 ENCOUNTER — Encounter: Payer: Self-pay | Admitting: Certified Nurse Midwife

## 2017-03-24 VITALS — BP 114/70 | HR 92 | Wt 141.7 lb

## 2017-03-24 DIAGNOSIS — Z3493 Encounter for supervision of normal pregnancy, unspecified, third trimester: Secondary | ICD-10-CM

## 2017-03-24 LAB — POCT URINALYSIS DIPSTICK
Bilirubin, UA: NEGATIVE
Glucose, UA: NEGATIVE
KETONES UA: NEGATIVE
Leukocytes, UA: NEGATIVE
Nitrite, UA: NEGATIVE
PH UA: 6 (ref 5.0–8.0)
RBC UA: NEGATIVE
SPEC GRAV UA: 1.025 (ref 1.010–1.025)
UROBILINOGEN UA: 0.2 U/dL

## 2017-03-24 NOTE — Patient Instructions (Signed)
Vaginal Delivery Vaginal delivery means that you will give birth by pushing your baby out of your birth canal (vagina). A team of health care providers will help you before, during, and after vaginal delivery. Birth experiences are unique for every woman and every pregnancy, and birth experiences vary depending on where you choose to give birth. What should I do to prepare for my baby's birth? Before your baby is born, it is important to talk with your health care provider about:  Your labor and delivery preferences. These may include: ? Medicines that you may be given. ? How you will manage your pain. This might include non-medical pain relief techniques or injectable pain relief such as epidural analgesia. ? How you and your baby will be monitored during labor and delivery. ? Who may be in the labor and delivery room with you. ? Your feelings about surgical delivery of your baby (cesarean delivery, or C-section) if this becomes necessary. ? Your feelings about receiving donated blood through an IV tube (blood transfusion) if this becomes necessary.  Whether you are able: ? To take pictures or videos of the birth. ? To eat during labor and delivery. ? To move around, walk, or change positions during labor and delivery.  What to expect after your baby is born, such as: ? Whether delayed umbilical cord clamping and cutting is offered. ? Who will care for your baby right after birth. ? Medicines or tests that may be recommended for your baby. ? Whether breastfeeding is supported in your hospital or birth center. ? How long you will be in the hospital or birth center.  How any medical conditions you have may affect your baby or your labor and delivery experience.  To prepare for your baby's birth, you should also:  Attend all of your health care visits before delivery (prenatal visits) as recommended by your health care provider. This is important.  Prepare your home for your baby's  arrival. Make sure that you have: ? Diapers. ? Baby clothing. ? Feeding equipment. ? Safe sleeping arrangements for you and your baby.  Install a car seat in your vehicle. Have your car seat checked by a certified car seat installer to make sure that it is installed safely.  Think about who will help you with your new baby at home for at least the first several weeks after delivery.  What can I expect when I arrive at the birth center or hospital? Once you are in labor and have been admitted into the hospital or birth center, your health care provider may:  Review your pregnancy history and any concerns you have.  Insert an IV tube into one of your veins. This is used to give you fluids and medicines.  Check your blood pressure, pulse, temperature, and heart rate (vital signs).  Check whether your bag of water (amniotic sac) has broken (ruptured).  Talk with you about your birth plan and discuss pain control options.  Monitoring Your health care provider may monitor your contractions (uterine monitoring) and your baby's heart rate (fetal monitoring). You may need to be monitored:  Often, but not continuously (intermittently).  All the time or for long periods at a time (continuously). Continuous monitoring may be needed if: ? You are taking certain medicines, such as medicine to relieve pain or make your contractions stronger. ? You have pregnancy or labor complications.  Monitoring may be done by:  Placing a special stethoscope or a handheld monitoring device on your abdomen to   check your baby's heartbeat, and feeling your abdomen for contractions. This method of monitoring does not continuously record your baby's heartbeat or your contractions.  Placing monitors on your abdomen (external monitors) to record your baby's heartbeat and the frequency and length of contractions. You may not have to wear external monitors all the time.  Placing monitors inside of your uterus  (internal monitors) to record your baby's heartbeat and the frequency, length, and strength of your contractions. ? Your health care provider may use internal monitors if he or she needs more information about the strength of your contractions or your baby's heart rate. ? Internal monitors are put in place by passing a thin, flexible wire through your vagina and into your uterus. Depending on the type of monitor, it may remain in your uterus or on your baby's head until birth. ? Your health care provider will discuss the benefits and risks of internal monitoring with you and will ask for your permission before inserting the monitors.  Telemetry. This is a type of continuous monitoring that can be done with external or internal monitors. Instead of having to stay in bed, you are able to move around during telemetry. Ask your health care provider if telemetry is an option for you.  Physical exam Your health care provider may perform a physical exam. This may include:  Checking whether your baby is positioned: ? With the head toward your vagina (head-down). This is most common. ? With the head toward the top of your uterus (head-up or breech). If your baby is in a breech position, your health care provider may try to turn your baby to a head-down position so you can deliver vaginally. If it does not seem that your baby can be born vaginally, your provider may recommend surgery to deliver your baby. In rare cases, you may be able to deliver vaginally if your baby is head-up (breech delivery). ? Lying sideways (transverse). Babies that are lying sideways cannot be delivered vaginally.  Checking your cervix to determine: ? Whether it is thinning out (effacing). ? Whether it is opening up (dilating). ? How low your baby has moved into your birth canal.  What are the three stages of labor and delivery?  Normal labor and delivery is divided into the following three stages: Stage 1  Stage 1 is the  longest stage of labor, and it can last for hours or days. Stage 1 includes: ? Early labor. This is when contractions may be irregular, or regular and mild. Generally, early labor contractions are more than 10 minutes apart. ? Active labor. This is when contractions get longer, more regular, more frequent, and more intense. ? The transition phase. This is when contractions happen very close together, are very intense, and may last longer than during any other part of labor.  Contractions generally feel mild, infrequent, and irregular at first. They get stronger, more frequent (about every 2-3 minutes), and more regular as you progress from early labor through active labor and transition.  Many women progress through stage 1 naturally, but you may need help to continue making progress. If this happens, your health care provider may talk with you about: ? Rupturing your amniotic sac if it has not ruptured yet. ? Giving you medicine to help make your contractions stronger and more frequent.  Stage 1 ends when your cervix is completely dilated to 4 inches (10 cm) and completely effaced. This happens at the end of the transition phase. Stage 2  Once   your cervix is completely effaced and dilated to 4 inches (10 cm), you may start to feel an urge to push. It is common for the body to naturally take a rest before feeling the urge to push, especially if you received an epidural or certain other pain medicines. This rest period may last for up to 1-2 hours, depending on your unique labor experience.  During stage 2, contractions are generally less painful, because pushing helps relieve contraction pain. Instead of contraction pain, you may feel stretching and burning pain, especially when the widest part of your baby's head passes through the vaginal opening (crowning).  Your health care provider will closely monitor your pushing progress and your baby's progress through the vagina during stage 2.  Your  health care provider may massage the area of skin between your vaginal opening and anus (perineum) or apply warm compresses to your perineum. This helps it stretch as the baby's head starts to crown, which can help prevent perineal tearing. ? In some cases, an incision may be made in your perineum (episiotomy) to allow the baby to pass through the vaginal opening. An episiotomy helps to make the opening of the vagina larger to allow more room for the baby to fit through.  It is very important to breathe and focus so your health care provider can control the delivery of your baby's head. Your health care provider may have you decrease the intensity of your pushing, to help prevent perineal tearing.  After delivery of your baby's head, the shoulders and the rest of the body generally deliver very quickly and without difficulty.  Once your baby is delivered, the umbilical cord may be cut right away, or this may be delayed for 1-2 minutes, depending on your baby's health. This may vary among health care providers, hospitals, and birth centers.  If you and your baby are healthy enough, your baby may be placed on your chest or abdomen to help maintain the baby's temperature and to help you bond with each other. Some mothers and babies start breastfeeding at this time. Your health care team will dry your baby and help keep your baby warm during this time.  Your baby may need immediate care if he or she: ? Showed signs of distress during labor. ? Has a medical condition. ? Was born too early (prematurely). ? Had a bowel movement before birth (meconium). ? Shows signs of difficulty transitioning from being inside the uterus to being outside of the uterus. If you are planning to breastfeed, your health care team will help you begin a feeding. Stage 3  The third stage of labor starts immediately after the birth of your baby and ends after you deliver the placenta. The placenta is an organ that develops  during pregnancy to provide oxygen and nutrients to your baby in the womb.  Delivering the placenta may require some pushing, and you may have mild contractions. Breastfeeding can stimulate contractions to help you deliver the placenta.  After the placenta is delivered, your uterus should tighten (contract) and become firm. This helps to stop bleeding in your uterus. To help your uterus contract and to control bleeding, your health care provider may: ? Give you medicine by injection, through an IV tube, by mouth, or through your rectum (rectally). ? Massage your abdomen or perform a vaginal exam to remove any blood clots that are left in your uterus. ? Empty your bladder by placing a thin, flexible tube (catheter) into your bladder. ? Encourage   you to breastfeed your baby. After labor is over, you and your baby will be monitored closely to ensure that you are both healthy until you are ready to go home. Your health care team will teach you how to care for yourself and your baby. This information is not intended to replace advice given to you by your health care provider. Make sure you discuss any questions you have with your health care provider. Document Released: 04/06/2008 Document Revised: 01/16/2016 Document Reviewed: 07/13/2015 Elsevier Interactive Patient Education  2018 [employer]  

## 2017-03-24 NOTE — Progress Notes (Signed)
ROB-Pt doing well, reports irregular contractions. Education regarding intrapartum pain management options. GBS negative, results discussed with patient. Reviewed red flag symptoms and when to call. RTC x 1 week for ROB or sooner if needed.

## 2017-04-01 ENCOUNTER — Ambulatory Visit (INDEPENDENT_AMBULATORY_CARE_PROVIDER_SITE_OTHER): Payer: BLUE CROSS/BLUE SHIELD | Admitting: Certified Nurse Midwife

## 2017-04-01 ENCOUNTER — Encounter: Payer: Self-pay | Admitting: Certified Nurse Midwife

## 2017-04-01 VITALS — BP 110/66 | HR 93 | Wt 142.6 lb

## 2017-04-01 DIAGNOSIS — Z3493 Encounter for supervision of normal pregnancy, unspecified, third trimester: Secondary | ICD-10-CM

## 2017-04-01 LAB — POCT URINALYSIS DIPSTICK
BILIRUBIN UA: NEGATIVE
Blood, UA: NEGATIVE
Glucose, UA: NEGATIVE
Ketones, UA: NEGATIVE
NITRITE UA: NEGATIVE
PH UA: 6.5 (ref 5.0–8.0)
Spec Grav, UA: 1.01 (ref 1.010–1.025)
UROBILINOGEN UA: 0.2 U/dL

## 2017-04-01 NOTE — Progress Notes (Signed)
ROB, doing well. No complaints. Reviewed labor precautions and remaining prenatal course. She will return in 1 wks for BPP and ROB. SVE today 1 cm , high, firm.  Discussed fetal movements.   Doreene Burke, CNM

## 2017-04-01 NOTE — Patient Instructions (Signed)

## 2017-04-01 NOTE — Progress Notes (Signed)
ROB- no complaints.  

## 2017-04-04 ENCOUNTER — Other Ambulatory Visit: Payer: Self-pay | Admitting: Obstetrics and Gynecology

## 2017-04-04 DIAGNOSIS — O480 Post-term pregnancy: Secondary | ICD-10-CM

## 2017-04-06 ENCOUNTER — Ambulatory Visit (INDEPENDENT_AMBULATORY_CARE_PROVIDER_SITE_OTHER): Payer: BLUE CROSS/BLUE SHIELD | Admitting: Obstetrics and Gynecology

## 2017-04-06 ENCOUNTER — Ambulatory Visit (INDEPENDENT_AMBULATORY_CARE_PROVIDER_SITE_OTHER): Payer: BLUE CROSS/BLUE SHIELD

## 2017-04-06 VITALS — BP 118/78 | HR 92 | Wt 142.0 lb

## 2017-04-06 DIAGNOSIS — Z3493 Encounter for supervision of normal pregnancy, unspecified, third trimester: Secondary | ICD-10-CM

## 2017-04-06 DIAGNOSIS — O480 Post-term pregnancy: Secondary | ICD-10-CM

## 2017-04-06 DIAGNOSIS — Z23 Encounter for immunization: Secondary | ICD-10-CM | POA: Diagnosis not present

## 2017-04-06 LAB — POCT URINALYSIS DIPSTICK
BILIRUBIN UA: NEGATIVE
GLUCOSE UA: NEGATIVE
KETONES UA: NEGATIVE
Leukocytes, UA: NEGATIVE
Nitrite, UA: NEGATIVE
Protein, UA: NEGATIVE
Spec Grav, UA: 1.01 (ref 1.010–1.025)
UROBILINOGEN UA: 0.2 U/dL
pH, UA: 6.5 (ref 5.0–8.0)

## 2017-04-06 NOTE — Progress Notes (Signed)
ROB- growth scan done today, pt is doing well, having some pelvic pain

## 2017-04-06 NOTE — Progress Notes (Signed)
ROB-doing well, no concerns. Reviewed today's ultrasound:  Indications: BPP Findings:  Mason Jim intrauterine pregnancy is visualized with FHR at 140 BPM.   Fetal presentation is vertex, spine left lateral.  Placenta: Right lateral and grade 3 with numerous areas of calcification seen. AFI: Adequate at 10.0 cm.  Anatomic survey of the fetal stomach, bladder, kidneys and lateral ventricle appear WNL. Gender - Female.  BPP: 8/8 with good fetal breathing movements seen.     Impression: 1. BPP is 8/8 with good movement, tone, AFI and fetal breathing movements seen.

## 2017-04-06 NOTE — Patient Instructions (Signed)
Nonstress Test The nonstress test is a procedure that monitors the fetus's heartbeat. The test will monitor the heartbeat when the fetus is at rest and while the fetus is moving. In a healthy fetus, there will be an increase in fetal heart rate when the fetus moves or kicks. The heart rate will decrease at rest. This test helps determine if the fetus is healthy. Your health care provider will look at a number of patterns in the heart rate tracing to make sure your baby is thriving. If there is concern, your health care provider may order additional tests or may suggest another course of action. This test is often done in the third trimester and can help determine if an early delivery is needed and safe. Common reasons to have this test are:  You are past your due date.  You have a high-risk pregnancy.  You are feeling less movement than normal.  You have lost a pregnancy in the past.  Your health care provider suspects fetal growth problems.  You have too much or too little amniotic fluid.  What happens before the procedure?  Eat a meal right before the test or as directed by your health care provider. Food may help stimulate fetal movements.  Use the restroom right before the test. What happens during the procedure?  Two belts will be placed around your abdomen. These belts have monitors attached to them. One records the fetal heart rate and the other records uterine contractions.  You may be asked to lie down on your side or to stay sitting upright.  You may be given a button to press when you feel movement.  The fetal heartbeat is listened to and watched on a screen. The heartbeat is recorded on a sheet of paper.  If the fetus seems to be sleeping, you may be asked to drink some juice or soda, gently press your abdomen, or make some noise to wake the fetus. What happens after the procedure? Your health care provider will discuss the test results with you and make recommendations  for the near future.  This information is not intended to replace advice given to you by your health care provider. Make sure you discuss any questions you have with your health care provider. This information is not intended to replace advice given to you by your health care provider. Make sure you discuss any questions you have with your health care provider. Document Released: 06/18/2002 Document Revised: 05/28/2016 Document Reviewed: 08/01/2012 Elsevier Interactive Patient Education  2018 [employer] Inc[employer]  

## 2017-04-06 NOTE — Addendum Note (Signed)
Addended by: Rosine Beat L on: 04/06/2017 04:40 PM   Modules accepted: Orders

## 2017-04-12 ENCOUNTER — Ambulatory Visit (INDEPENDENT_AMBULATORY_CARE_PROVIDER_SITE_OTHER): Payer: BLUE CROSS/BLUE SHIELD | Admitting: Certified Nurse Midwife

## 2017-04-12 VITALS — BP 103/71 | HR 85 | Wt 142.0 lb

## 2017-04-12 DIAGNOSIS — Z3493 Encounter for supervision of normal pregnancy, unspecified, third trimester: Secondary | ICD-10-CM | POA: Diagnosis not present

## 2017-04-12 LAB — POCT URINALYSIS DIPSTICK
Glucose, UA: NEGATIVE
PROTEIN UA: NEGATIVE
Spec Grav, UA: 1.02 (ref 1.010–1.025)
UROBILINOGEN UA: 0.2 U/dL

## 2017-04-12 NOTE — Patient Instructions (Signed)
Augmentation of Labor Augmentation of labor is when steps are taken to stimulate and strengthen uterine contractions during labor. This may be done when the contractions have slowed down or stopped, delaying progress of labor and delivery of the baby. Before beginning augmentation of labor, the health care provider will evaluate the condition of the mother and baby, the size and position of the baby, and the size of the birth canal. What are the reasons for labor augmentation? Reasons for augmentation of labor include:  Slow labor (prolonged first and second stage of labor) that has been associated with increased maternal risks, such as chorioamnionitis, postpartum hemorrhage, operative vaginal delivery, or third-degree or fourth-degree perineal lacerations.  Decreased average length of labor.  What methods are used for labor augmentation? Various methods may be used for augmentation of labor, including:  Oxytocin medicine. This medicine stimulates contractions. It is given through an IV access tube inserted into a vein.  Breaking the fluid-filled sac that surrounds the fetus (amniotic sac).  Stripping the membranes. The health care provider separates amniotic sac tissue from the cervix, causing the release of a hormone called progesterone that can stimulate uterine contractions.  Nipple stimulation.  Stimulation of certain pressure points on the ankles.  Manual or mechanical dilation of the cervix.  What are the risks associated with labor augmentation?  Overstimulation of the uterine contractions (continuous, prolonged, very strong contractions), causing fetal distress.  Increased chance of infection for the mother and baby.  Uterine tearing (rupture).  Breaking off (abruption) of the placenta.  Increased chance of cesarean, forceps, or vacuum delivery. What are some reasons for not doing labor augmentation? Augmentation of labor should not be done if:  The baby is too big for  the birth canal. This can be confirmed by ultrasonography.  The umbilical cord drops in front of the baby's head or breech part (prolapsed cord).  The mother had a previous cesarean delivery with a vertical incision in the uterus (or the kind of incision used is not known). High dose oxytocin should not be used if the mother had a previous cesarean delivery of any kind.  The mother had previous surgery on or into the uterus.  The mother has herpes.  The mother has cervical cancer.  The baby is lying sideways.  The mother's pelvis is deformed.  The mother is pregnant with more than two babies.  This information is not intended to replace advice given to you by your health care provider. Make sure you discuss any questions you have with your health care provider. Document Released: 12/21/2006 Document Revised: 12/10/2015 Document Reviewed: 01/25/2013 Elsevier Interactive Patient Education  2017 [employer] Inc.  

## 2017-04-12 NOTE — Progress Notes (Signed)
ROB-Pt doing well.  NST performed today was reviewed and was found to be reactive. Baseline 135 with Moderate variability; No decels noted.Discussed signs and symptoms of labor and when to call. Induction of labor scheduled for Friday, 04/15/2017 @ 0500. RTC x 6 weeks for PPV with Pattricia Boss or sooner if needed.

## 2017-04-12 NOTE — Addendum Note (Signed)
Addended by: Shaune Spittle on: 04/12/2017 02:43 PM   Modules accepted: Orders, SmartSet

## 2017-04-14 ENCOUNTER — Inpatient Hospital Stay
Admission: EM | Admit: 2017-04-14 | Discharge: 2017-04-16 | DRG: 807 | Disposition: A | Discharge status: Home / Self Care | Payer: BLUE CROSS/BLUE SHIELD | Attending: Certified Nurse Midwife | Admitting: Certified Nurse Midwife

## 2017-04-14 ENCOUNTER — Observation Stay
Admission: EM | Admit: 2017-04-14 | Discharge: 2017-04-14 | Disposition: A | Discharge status: Home / Self Care | Payer: BLUE CROSS/BLUE SHIELD | Source: Home / Self Care | Admitting: Obstetrics and Gynecology

## 2017-04-14 DIAGNOSIS — O26893 Other specified pregnancy related conditions, third trimester: Secondary | ICD-10-CM | POA: Diagnosis present

## 2017-04-14 DIAGNOSIS — O9952 Diseases of the respiratory system complicating childbirth: Secondary | ICD-10-CM | POA: Diagnosis present

## 2017-04-14 DIAGNOSIS — J45909 Unspecified asthma, uncomplicated: Secondary | ICD-10-CM | POA: Diagnosis present

## 2017-04-14 DIAGNOSIS — Z3A40 40 weeks gestation of pregnancy: Secondary | ICD-10-CM

## 2017-04-14 DIAGNOSIS — O479 False labor, unspecified: Secondary | ICD-10-CM

## 2017-04-14 MED ORDER — FLEET ENEMA 7-19 GM/118ML RE ENEM: 1.0000 | ENEMA | Freq: Every day | RECTAL | Status: DC | PRN

## 2017-04-14 MED ORDER — SOD CITRATE-CITRIC ACID 500-334 MG/5ML PO SOLN: 30.0000 mL | ORAL | Status: DC | PRN

## 2017-04-14 MED ORDER — LACTATED RINGERS IV SOLN
500.0000 mL | INTRAVENOUS | Status: DC | PRN
  Administered 2017-04-15: 500 mL via INTRAVENOUS

## 2017-04-14 MED ORDER — BUTORPHANOL TARTRATE 2 MG/ML IJ SOLN: 1.0000 mg | INTRAMUSCULAR | Status: DC | PRN

## 2017-04-14 MED ORDER — MORPHINE SULFATE (PF) 4 MG/ML IV SOLN
8.0000 mg | Freq: Once | INTRAVENOUS | Status: AC
  Administered 2017-04-14: 8 mg via INTRAMUSCULAR
  Filled 2017-04-14: qty 2

## 2017-04-14 MED ORDER — PROMETHAZINE HCL 25 MG/ML IJ SOLN
12.5000 mg | Freq: Four times a day (QID) | INTRAMUSCULAR | Status: DC | PRN
  Administered 2017-04-14: 12.5 mg via INTRAMUSCULAR
  Filled 2017-04-14: qty 1

## 2017-04-14 MED ORDER — OXYCODONE-ACETAMINOPHEN 5-325 MG PO TABS
2.0000 | ORAL_TABLET | ORAL | Status: DC | PRN
Start: 2017-04-14 — End: 2017-04-15

## 2017-04-14 MED ORDER — ACETAMINOPHEN 325 MG PO TABS: 650.0000 mg | ORAL_TABLET | ORAL | Status: DC | PRN

## 2017-04-14 MED ORDER — OXYCODONE-ACETAMINOPHEN 5-325 MG PO TABS: 1.0000 | ORAL_TABLET | ORAL | Status: DC | PRN

## 2017-04-14 MED ORDER — OXYTOCIN BOLUS FROM INFUSION
500.0000 mL | Freq: Once | INTRAVENOUS | Status: DC
  Administered 2017-04-15: 500 mL via INTRAVENOUS

## 2017-04-14 MED ORDER — ONDANSETRON HCL 4 MG/2ML IJ SOLN: 4.0000 mg | Freq: Four times a day (QID) | INTRAMUSCULAR | Status: DC | PRN

## 2017-04-14 MED ORDER — LIDOCAINE HCL (PF) 1 % IJ SOLN: 30.0000 mL | INTRAMUSCULAR | Status: DC | PRN

## 2017-04-14 MED ORDER — OXYTOCIN 40 UNITS IN LACTATED RINGERS INFUSION - SIMPLE MED
2.5000 [IU]/h | INTRAVENOUS | Status: DC
  Filled 2017-04-14: qty 1000

## 2017-04-14 MED ORDER — LACTATED RINGERS IV SOLN
INTRAVENOUS | Status: DC
  Administered 2017-04-15: 03:00:00 via INTRAVENOUS

## 2017-04-14 NOTE — OB Triage Note (Signed)
Pt arrival to triage with c/o contractions every 3-94min for around 2 hours.  Denies LOF and vaginal bleeding.  Pt states she is feeling baby move normally.  EFM and toco applied and tracing.

## 2017-04-14 NOTE — Discharge Summary (Signed)
Reviewed discharge instructions with pat. Gave opportunity for questions. Went over signs and symptoms of labor and when to notify provider or return back to the hospital. Pt voiced understanding. Pt plans to return tomorrow morning for scheduled induction. Pt discharged home with family member.

## 2017-04-14 NOTE — OB Triage Note (Signed)
Pt presents c/o ctx that started around 6 am. Pt states that they have been 6-7 minutes apart. Pt rates ctx 5/10 on pain scale. Pt denies any bleeding or LOF. Reports positive fetal movement. Vitals WNL, will continue to monitor.

## 2017-04-14 NOTE — Progress Notes (Signed)
Pt removed from monitors for an hour to walk to see if she can make cervical change Per  Serafina Royals, CNM. Will continue to monitor.

## 2017-04-14 NOTE — H&P (Signed)
Obstetric History and Physical  Michelle Arellano is a 26 y.o. G2P0010 with IUP at [redacted]w[redacted]d, dated by 7 week ultrasound, presenting with painful, regular uterine contractions over a two (2) hour period.   Patient states she has been having none vaginal bleeding, intact membranes, with active fetal movement.    Denies difficulty breathing or respiratory distress, chest pain, and leg pain or swelling.   Prenatal Course  Source of Care: [facility]-initial visit at 11 weeks, total visits: 13  Pregnancy complications or risks: history of marijuana  Prenatal labs and studies:  ABO, Rh: O/Positive/-- 08-30-22 1155)  Antibody: Negative 08-30-2022 1155)  Rubella: 1.73 Aug 30, 2022 1155)  Varicella: 3355 2022/08/30 1155)  RPR: Non Reactive August 30, 2022 1155)   HBsAg: Negative 08/30/22 1155)   HIV: Non Reactive Aug 30, 2022 1155)  [id number] (09/07 1550)  1 hr Glucola: 99 (06/29 0916)  Genetic screening: Normal (05/04 0000)  Anatomy US: Normal (05/21 9811)  Past Medical History:  Diagnosis Date  . Bell's palsy     Past Surgical History:  Procedure Laterality Date  . cyst removed     rt arm    OB History  Gravida Para Term Preterm AB Living  2       1 0  SAB TAB Ectopic Multiple Live Births    1          # Outcome Date GA Lbr Len/2nd Weight Sex Delivery Anes PTL Lv  2 Current           1 TAB 2006              Social History   Social History  . Marital status: Single    Spouse name: N/A  . Number of children: N/A  . Years of education: N/A   Social History Main Topics  . Smoking status: Never Smoker  . Smokeless tobacco: Never Used  . Alcohol use Yes     Comment: wine occas  . Drug use: Yes    Frequency: 1.0 time per week    Types: Marijuana     Comment: sttopped when she found out she was pregnant  . Sexual activity: Yes    Partners: Male    Birth control/ protection: None   Other Topics Concern  . None   Social History Narrative  . None    Family History  Problem Relation Age  of Onset  . Diabetes Mother   . Heart disease Maternal Grandmother   . Heart failure Maternal Grandfather   . Lung cancer Maternal Grandfather   . Asthma Neg Hx   . Ovarian cancer Neg Hx   . Colon cancer Neg Hx     Prescriptions Prior to Admission  Medication Sig Dispense Refill Last Dose  . fluticasone (FLONASE) 50 MCG/ACT nasal spray Place into the nose.   Past Week at Unknown time  . prenatal vitamin w/FE, FA (PRENATAL 1 + 1) 27-1 MG TABS tablet Take 1 tablet by mouth daily at 12 noon.   04/13/2017 at Unknown time  . albuterol (PROVENTIL HFA;VENTOLIN HFA) 108 (90 Base) MCG/ACT inhaler Inhale into the lungs.   Not Taking at Unknown time    No Known Allergies  Review of Systems: Negative except for what is mentioned in HPI.  Physical Exam:  Temp:  [97.5 F (36.4 C)-97.7 F (36.5 C)] 97.7 F (36.5 C) (10/04 2036) Pulse Rate:  [82-86] 86 (10/04 2036) Resp:  [16-18] 18 (10/04 2036) BP: (105-119)/(69-82) 119/82 (10/04 2036) SpO2:  [100 %] 100 % (  10/04 0929) Weight:  [142 lb (64.4 kg)-143 lb (64.9 kg)] 143 lb (64.9 kg) (10/04 2030)  GENERAL: Well-developed, well-nourished female in no acute distress.   LUNGS: Clear to auscultation bilaterally.   HEART: Regular rate and rhythm.  ABDOMEN: Soft, nontender, nondistended, gravid.  EXTREMITIES: Nontender, no edema, 2+ distal pulses.  Cervical Exam: Dilation: 7.5 Effacement (%): 70 Cervical Position: Posterior Station: -1 Presentation: Vertex Exam by:: Sherrill Raring RN  FHT:  Baseline rate 135 bpm   Variability moderate  Accelerations present   Decelerations none  Contractions: Every two (2) to four (4) minutes, soft resting tone   Pertinent Labs/Studies:    No results found for this or any previous visit (from the past 24 hour(s)).  Assessment :  Michelle Arellano is a 26 y.o. G2P0010 at [redacted]w[redacted]d being admitted for labor, Rh positive, GBS negative  FHR Category I  Plan:  Admit to birthing suites for active labor.    Labor: Expectant management.  Induction/Augmentation as needed, per protocol.  Delivery plan: Hopeful for vaginal delivery.   Gunnar Bulla, CNM [facility], [facility]

## 2017-04-15 ENCOUNTER — Inpatient Hospital Stay: Payer: BLUE CROSS/BLUE SHIELD | Admitting: Anesthesiology

## 2017-04-15 ENCOUNTER — Encounter: Payer: Self-pay | Admitting: Anesthesiology

## 2017-04-15 DIAGNOSIS — O9952 Diseases of the respiratory system complicating childbirth: Principal | ICD-10-CM

## 2017-04-15 DIAGNOSIS — Z3A40 40 weeks gestation of pregnancy: Secondary | ICD-10-CM

## 2017-04-15 DIAGNOSIS — J45909 Unspecified asthma, uncomplicated: Secondary | ICD-10-CM

## 2017-04-15 LAB — CBC
HEMATOCRIT: 33.9 % — AB (ref 35.0–47.0)
Hemoglobin: 11.5 g/dL — ABNORMAL LOW (ref 12.0–16.0)
MCH: 28.4 pg (ref 26.0–34.0)
MCHC: 34 g/dL (ref 32.0–36.0)
MCV: 83.6 fL (ref 80.0–100.0)
Platelets: 239 10*3/uL (ref 150–440)
RBC: 4.05 MIL/uL (ref 3.80–5.20)
RDW: 13.7 % (ref 11.5–14.5)
WBC: 16.9 10*3/uL — AB (ref 3.6–11.0)

## 2017-04-15 LAB — TYPE AND SCREEN
ABO/RH(D): O POS
Antibody Screen: NEGATIVE

## 2017-04-15 MED ORDER — ALBUTEROL SULFATE (2.5 MG/3ML) 0.083% IN NEBU: 3.0000 mL | INHALATION_SOLUTION | RESPIRATORY_TRACT | Status: DC | PRN

## 2017-04-15 MED ORDER — ACETAMINOPHEN 325 MG PO TABS: 650.0000 mg | ORAL_TABLET | ORAL | Status: DC | PRN

## 2017-04-15 MED ORDER — SENNOSIDES-DOCUSATE SODIUM 8.6-50 MG PO TABS
2.0000 | ORAL_TABLET | ORAL | Status: DC
  Administered 2017-04-15: 2 via ORAL
  Filled 2017-04-15: qty 2

## 2017-04-15 MED ORDER — LIDOCAINE-EPINEPHRINE (PF) 1.5 %-1:200000 IJ SOLN
INTRAMUSCULAR | Status: DC | PRN
  Administered 2017-04-15: 3 mL via EPIDURAL

## 2017-04-15 MED ORDER — LIDOCAINE HCL (PF) 1 % IJ SOLN
INTRAMUSCULAR | Status: DC | PRN
  Administered 2017-04-15: 1 mL via INTRADERMAL

## 2017-04-15 MED ORDER — FENTANYL 2.5 MCG/ML W/ROPIVACAINE 0.15% IN NS 100 ML EPIDURAL (ARMC): 12.0000 mL/h | EPIDURAL | Status: DC

## 2017-04-15 MED ORDER — AMMONIA AROMATIC IN INHA
RESPIRATORY_TRACT | Status: AC
  Filled 2017-04-15: qty 10

## 2017-04-15 MED ORDER — LIDOCAINE HCL (PF) 1 % IJ SOLN
INTRAMUSCULAR | Status: AC
  Administered 2017-04-15: 30 mL
  Filled 2017-04-15: qty 30

## 2017-04-15 MED ORDER — OXYTOCIN 10 UNIT/ML IJ SOLN
INTRAMUSCULAR | Status: AC
  Filled 2017-04-15: qty 2

## 2017-04-15 MED ORDER — BENZOCAINE-MENTHOL 20-0.5 % EX AERO: 1.0000 "application " | INHALATION_SPRAY | CUTANEOUS | Status: DC | PRN

## 2017-04-15 MED ORDER — WITCH HAZEL-GLYCERIN EX PADS: 1.0000 "application " | MEDICATED_PAD | CUTANEOUS | Status: DC | PRN

## 2017-04-15 MED ORDER — EPHEDRINE 5 MG/ML INJ: 10.0000 mg | INTRAVENOUS | Status: DC | PRN

## 2017-04-15 MED ORDER — DOCUSATE SODIUM 100 MG PO CAPS
100.0000 mg | ORAL_CAPSULE | Freq: Two times a day (BID) | ORAL | Status: DC
  Administered 2017-04-15 – 2017-04-16 (×2): 100 mg via ORAL
  Filled 2017-04-15 (×2): qty 1

## 2017-04-15 MED ORDER — HYDROCORTISONE 1 % EX CREA
TOPICAL_CREAM | Freq: Four times a day (QID) | CUTANEOUS | Status: DC
  Administered 2017-04-15 – 2017-04-16 (×4): via TOPICAL
  Filled 2017-04-15: qty 28

## 2017-04-15 MED ORDER — PHENYLEPHRINE 40 MCG/ML (10ML) SYRINGE FOR IV PUSH (FOR BLOOD PRESSURE SUPPORT): 80.0000 ug | PREFILLED_SYRINGE | INTRAVENOUS | Status: DC | PRN

## 2017-04-15 MED ORDER — ZOLPIDEM TARTRATE 5 MG PO TABS: 5.0000 mg | ORAL_TABLET | Freq: Every evening | ORAL | Status: DC | PRN

## 2017-04-15 MED ORDER — FENTANYL 2.5 MCG/ML W/ROPIVACAINE 0.15% IN NS 100 ML EPIDURAL (ARMC)
EPIDURAL | Status: AC
  Filled 2017-04-15: qty 100

## 2017-04-15 MED ORDER — DIPHENHYDRAMINE HCL 25 MG PO CAPS: 25.0000 mg | ORAL_CAPSULE | Freq: Four times a day (QID) | ORAL | Status: DC | PRN

## 2017-04-15 MED ORDER — DIPHENHYDRAMINE HCL 50 MG/ML IJ SOLN: 12.5000 mg | INTRAMUSCULAR | Status: DC | PRN

## 2017-04-15 MED ORDER — TETANUS-DIPHTH-ACELL PERTUSSIS 5-2.5-18.5 LF-MCG/0.5 IM SUSP: 0.5000 mL | Freq: Once | INTRAMUSCULAR | Status: DC

## 2017-04-15 MED ORDER — DIBUCAINE 1 % RE OINT: 1.0000 "application " | TOPICAL_OINTMENT | RECTAL | Status: DC | PRN

## 2017-04-15 MED ORDER — IBUPROFEN 200 MG PO TABS
600.0000 mg | ORAL_TABLET | Freq: Four times a day (QID) | ORAL | Status: DC
  Administered 2017-04-15 – 2017-04-16 (×5): 600 mg via ORAL
  Filled 2017-04-15 (×4): qty 1

## 2017-04-15 MED ORDER — WHITE PETROLATUM GEL: Status: DC | PRN

## 2017-04-15 MED ORDER — PRENATAL MULTIVITAMIN CH
1.0000 | ORAL_TABLET | Freq: Every day | ORAL | Status: DC
  Administered 2017-04-15 – 2017-04-16 (×2): 1 via ORAL
  Filled 2017-04-15 (×2): qty 1

## 2017-04-15 MED ORDER — COCONUT OIL OIL: 1.0000 "application " | TOPICAL_OIL | Status: DC | PRN

## 2017-04-15 MED ORDER — MISOPROSTOL 200 MCG PO TABS
ORAL_TABLET | ORAL | Status: AC
  Filled 2017-04-15: qty 4

## 2017-04-15 MED ORDER — LACTATED RINGERS IV SOLN: 500.0000 mL | Freq: Once | INTRAVENOUS | Status: DC

## 2017-04-15 MED ORDER — SIMETHICONE 80 MG PO CHEW: 80.0000 mg | CHEWABLE_TABLET | ORAL | Status: DC | PRN

## 2017-04-15 MED ORDER — ONDANSETRON HCL 4 MG PO TABS: 4.0000 mg | ORAL_TABLET | ORAL | Status: DC | PRN

## 2017-04-15 MED ORDER — FENTANYL 2.5 MCG/ML W/ROPIVACAINE 0.15% IN NS 100 ML EPIDURAL (ARMC)
EPIDURAL | Status: DC | PRN
  Administered 2017-04-15: 12 mL/h via EPIDURAL

## 2017-04-15 MED ORDER — SODIUM CHLORIDE 0.9 % IV SOLN
INTRAVENOUS | Status: DC | PRN
  Administered 2017-04-15 (×2): 5 mL via EPIDURAL

## 2017-04-15 MED ORDER — ONDANSETRON HCL 4 MG/2ML IJ SOLN: 4.0000 mg | INTRAMUSCULAR | Status: DC | PRN

## 2017-04-15 NOTE — Anesthesia Procedure Notes (Signed)
Procedures

## 2017-04-15 NOTE — Anesthesia Procedure Notes (Signed)
Epidural Patient location during procedure: OB  Staffing Anesthesiologist: Margorie John K Performed: anesthesiologist   Preanesthetic Checklist Completed: patient identified, site marked, surgical consent, pre-op evaluation, timeout performed, IV checked, risks and benefits discussed and monitors and equipment checked  Epidural Patient position: sitting Prep: Betadine Patient monitoring: heart rate, continuous pulse ox and blood pressure Approach: midline Location: L4-L5 Injection technique: LOR saline  Needle:  Needle type: Tuohy  Needle gauge: 17 G Needle length: 9 cm and 9 Catheter type: closed end flexible Catheter size: 19 Gauge Test dose: negative and 1.5% lidocaine with Epi 1:200 K  Assessment Sensory level: T10 Events: blood not aspirated, injection not painful, no injection resistance, negative IV test and no paresthesia  Additional Notes Pt. Evaluated and documentation done after procedure finished. Patient identified. Risks/Benefits/Options discussed with patient including but not limited to bleeding, infection, nerve damage, paralysis, failed block, incomplete pain control, headache, blood pressure changes, nausea, vomiting, reactions to medication both or allergic, itching and postpartum back pain. Confirmed with bedside nurse the patient's most recent platelet count. Confirmed with patient that they are not currently taking any anticoagulation, have any bleeding history or any family history of bleeding disorders. Patient expressed understanding and wished to proceed. All questions were answered. Sterile technique was used throughout the entire procedure. Please see nursing notes for vital signs. Test dose was given through epidural catheter and negative prior to continuing to dose epidural or start infusion. Warning signs of high block given to the patient including shortness of breath, tingling/numbness in hands, complete motor block, or any concerning symptoms  with instructions to call for help. Patient was given instructions on fall risk and not to get out of bed. All questions and concerns addressed with instructions to call with any issues or inadequate analgesia.   Patient tolerated the insertion well without immediate complications.Reason for block:procedure for pain

## 2017-04-15 NOTE — Anesthesia Preprocedure Evaluation (Signed)
Anesthesia Evaluation  Patient identified by MRN, date of birth, ID band Patient awake    Reviewed: Allergy & Precautions, H&P , NPO status , Patient's Chart, lab work & pertinent test results  History of Anesthesia Complications Negative for: history of anesthetic complications  Airway Mallampati: III  TM Distance: >3 FB Neck ROM: full    Dental  (+) Chipped   Pulmonary neg shortness of breath, asthma ,           Cardiovascular Exercise Tolerance: Good (-) hypertensionnegative cardio ROS       Neuro/Psych  Neuromuscular disease    GI/Hepatic GERD  Medicated and Controlled,  Endo/Other    Renal/GU   negative genitourinary   Musculoskeletal   Abdominal   Peds  Hematology negative hematology ROS (+)   Anesthesia Other Findings Past Medical History: No date: Bell's palsy  Past Surgical History: No date: cyst removed     Comment:  rt arm  BMI    Body Mass Index:  25.33 kg/m      Reproductive/Obstetrics (+) Pregnancy                             Anesthesia Physical Anesthesia Plan  ASA: III  Anesthesia Plan: Epidural   Post-op Pain Management:    Induction:   PONV Risk Score and Plan:   Airway Management Planned:   Additional Equipment:   Intra-op Plan:   Post-operative Plan:   Informed Consent: I have reviewed the patients History and Physical, chart, labs and discussed the procedure including the risks, benefits and alternatives for the proposed anesthesia with the patient or authorized representative who has indicated his/her understanding and acceptance.     Plan Discussed with: Anesthesiologist  Anesthesia Plan Comments:         Anesthesia Quick Evaluation

## 2017-04-16 LAB — CBC
HEMATOCRIT: 27.7 % — AB (ref 35.0–47.0)
HEMOGLOBIN: 9.5 g/dL — AB (ref 12.0–16.0)
MCH: 28.7 pg (ref 26.0–34.0)
MCHC: 34.2 g/dL (ref 32.0–36.0)
MCV: 83.9 fL (ref 80.0–100.0)
Platelets: 198 10*3/uL (ref 150–440)
RBC: 3.3 MIL/uL — AB (ref 3.80–5.20)
RDW: 14.6 % — ABNORMAL HIGH (ref 11.5–14.5)
WBC: 14.5 10*3/uL — ABNORMAL HIGH (ref 3.6–11.0)

## 2017-04-16 LAB — RPR: RPR: NONREACTIVE

## 2017-04-16 NOTE — Discharge Summary (Signed)
Obstetric Discharge Summary  Patient ID: Michelle Arellano MRN: [id number] DOB/AGE: 28-18-1992 28 y.o.   Date of Admission: 04/14/2017 Serafina Royals, CNM Horton Marshall, MD)  Date of Discharge: 04/14/2017 Serafina Royals, CNM Horton Marshall, MD)  Admitting Diagnosis: Observation at [redacted]w[redacted]d  Secondary Diagnosis: None     Discharge Diagnosis: No other diagnosis   Antepartum Procedures: NST   Brief Hospital Course   L&D OB Triage Note  Michelle Arellano is a 26 y.o. G67P1011 female at [redacted]w[redacted]d, EDD Estimated Date of Delivery: 04/08/17 who presented to triage for complaints of uterine contractions.  She was evaluated by the nurses with no significant findings labor or fetal distress. Vital signs stable. An NST was performed and has been reviewed by CNM.   NST INTERPRETATION: Indications: rule out uterine contractions  Mode: External Baseline Rate (A): 130 bpm Variability: Moderate Accelerations: 15 x 15 Decelerations: Early     Contraction Frequency (min): 8  Impression: reactive   Plan: NST performed was reviewed and was found to be reactive. She was discharged home with bleeding/labor precautions. Continue routine prenatal care. Follow up with CNM as previously scheduled.   Discharge Instructions: Per After Visit Summary.  Activity: Refer to After Visit Summary  Diet: Regular  Medications:  Allergies as of 04/14/2017   No Known Allergies     Medication List    ASK your doctor about these medications   albuterol 108 (90 Base) MCG/ACT inhaler Commonly known as:  PROVENTIL HFA;VENTOLIN HFA Inhale into the lungs.   fluticasone 50 MCG/ACT nasal spray Commonly known as:  FLONASE Place into the nose.   prenatal vitamin w/FE, FA 27-1 MG Tabs tablet Take 1 tablet by mouth daily at 12 noon.       Discharged Condition: stable  Discharged to: home   Gunnar Bulla, [place]

## 2017-04-16 NOTE — Progress Notes (Signed)
D/C instructions provided, pt states understanding, aware of follow up appt.  Pt and family watching Period of Purple Cry.

## 2017-04-16 NOTE — Progress Notes (Signed)
D/C home to car via auxiliary in wheelchair.  

## 2017-04-16 NOTE — Anesthesia Postprocedure Evaluation (Signed)
Anesthesia Post Note  Patient: Michelle Arellano  Procedure(s) Performed: AN AD HOC LABOR EPIDURAL  Patient location during evaluation: Mother Baby Anesthesia Type: Epidural Level of consciousness: awake and alert and oriented Pain management: pain level controlled Vital Signs Assessment: post-procedure vital signs reviewed and stable Respiratory status: spontaneous breathing Cardiovascular status: blood pressure returned to baseline Postop Assessment: no headache, no backache and patient able to bend at knees Anesthetic complications: no     Last Vitals:  Vitals:   04/16/17 0315 04/16/17 0748  BP: 114/68 104/69  Pulse: 87 87  Resp: 18 20  Temp: 36.5 C 36.6 C  SpO2:  99%    Last Pain:  Vitals:   04/16/17 0915  TempSrc:   PainSc: 0-No pain                 Heith Haigler

## 2017-04-16 NOTE — Discharge Summary (Signed)
                             Discharge Summary  Date of Admission: 04/14/2017  Date of Discharge: 04/16/2017  Admitting Diagnosis: Onset of Labor at [redacted]w[redacted]d  Secondary Diagnosis: none  Mode of Delivery: normal spontaneous vaginal delivery  Discharge Diagnosis: No other diagnosis   Intrapartum Procedures: epidural   Post partum procedures: none  Complications: 1st degree perineal and right labial laceration                       Discharge Day SOAP Note:  Progress Note - Vaginal Delivery  Michelle Arellano is a 26 y.o. G2P1011 now PP day 1 s/p Vaginal, Spontaneous Delivery . Delivery was uncomplicated  Subjective  The patient has the following complaints: has no unusual complaints  Pain is controlled with current medications.   Patient is urinating without difficulty.  She is ambulating well.    Objective  Vital signs: BP 104/69   Pulse 87   Temp 97.8 F (36.6 C) (Oral)   Resp 20   Ht  (1.6 m)   Wt 143 lb (64.9 kg)   LMP 07/02/2016 (Exact Date)   SpO2 99%   Breastfeeding? Unknown   BMI 25.33 kg/m   Physical Exam: Gen: NAD Fundus Fundal Tone: Firm  Lochia Amount: Small  Perineum Appearance: Edematous     Data Review Labs: CBC Latest Ref Rng & Units 04/16/2017 04/14/2017 01/07/2017  WBC 3.6 - 11.0 K/uL 14.5(H) 16.9(H) 10.0  Hemoglobin 12.0 - 16.0 g/dL 9.6(E) 11.5(L) 10.7(L)  Hematocrit 35.0 - 47.0 % 27.7(L) 33.9(L) 32.0(L)  Platelets 150 - 440 K/uL 198 239 186   O POS  Assessment/Plan  Active Problems:   Indication for care in labor and delivery, antepartum    Plan for discharge today.   Discharge Instructions: Per After Visit Summary. Activity: Advance as tolerated. Pelvic rest for 6 weeks.  Also refer to After Visit Summary Diet: Regular Medications: Allergies as of 04/16/2017   No Known Allergies     Medication List    TAKE these medications   albuterol 108 (90 Base) MCG/ACT inhaler Commonly known as:  PROVENTIL HFA;VENTOLIN HFA Inhale  into the lungs.   fluticasone 50 MCG/ACT nasal spray Commonly known as:  FLONASE Place into the nose.   prenatal vitamin w/FE, FA 27-1 MG Tabs tablet Take 1 tablet by mouth daily at 12 noon.      Outpatient follow up:  Postpartum contraception: Nexplanon  Discharged Condition: good  Discharged to: home  Newborn Data: Disposition:home with mother  Apgars: APGAR (1 MIN):   APGAR (5 MINS):   APGAR (10 MINS):    Baby Feeding: Breast and pumping    Doreene Burke, CNM 04/16/2017 10:59 AM

## 2017-04-16 NOTE — Final Progress Note (Signed)
Discharge Day SOAP Note:  Progress Note - Vaginal Delivery  Michelle Arellano is a 26 y.o. G2P1011 now PP day 1 s/p Vaginal, Spontaneous Delivery . Delivery was uncomplicated  Subjective  The patient has the following complaints: has no unusual complaints  Pain is controlled with current medications.   Patient is urinating without difficulty.  She is ambulating well.    Objective  Vital signs: BP 104/69   Pulse 87   Temp 97.8 F (36.6 C) (Oral)   Resp 20   Ht  (1.6 m)   Wt 143 lb (64.9 kg)   LMP 07/02/2016 (Exact Date)   SpO2 99%   Breastfeeding? Unknown   BMI 25.33 kg/m   Physical Exam: Gen: NAD Fundus Fundal Tone: Firm  Lochia Amount: Small  Perineum Appearance: Edematous     Data Review Labs: CBC Latest Ref Rng & Units 04/16/2017 04/14/2017 01/07/2017  WBC 3.6 - 11.0 K/uL 14.5(H) 16.9(H) 10.0  Hemoglobin 12.0 - 16.0 g/dL 0.9(W) 11.5(L) 10.7(L)  Hematocrit 35.0 - 47.0 % 27.7(L) 33.9(L) 32.0(L)  Platelets 150 - 440 K/uL 198 239 186   O POS  Assessment/Plan  Active Problems:   Indication for care in labor and delivery, antepartum    Plan for discharge today.   Discharge Instructions: Per After Visit Summary. Activity: Advance as tolerated. Pelvic rest for 6 weeks.  Also refer to After Visit Summary Diet: Regular Medications: Allergies as of 04/16/2017   No Known Allergies     Medication List    TAKE these medications   albuterol 108 (90 Base) MCG/ACT inhaler Commonly known as:  PROVENTIL HFA;VENTOLIN HFA Inhale into the lungs.   fluticasone 50 MCG/ACT nasal spray Commonly known as:  FLONASE Place into the nose.   prenatal vitamin w/FE, FA 27-1 MG Tabs tablet Take 1 tablet by mouth daily at 12 noon.      Outpatient follow up:  Postpartum contraception: Nexplanon  Discharged Condition: good  Discharged to: home  Newborn Data: Disposition:home with mother  Apgars: APGAR (1 MIN):   APGAR (5 MINS):   APGAR (10 MINS):    Baby Feeding:  Breast and pumping    Doreene Burke, CNM 04/16/2017 10:59 AM

## 2017-05-27 ENCOUNTER — Ambulatory Visit (INDEPENDENT_AMBULATORY_CARE_PROVIDER_SITE_OTHER): Payer: BLUE CROSS/BLUE SHIELD | Admitting: Certified Nurse Midwife

## 2017-05-27 ENCOUNTER — Encounter: Payer: Self-pay | Admitting: Certified Nurse Midwife

## 2017-05-27 DIAGNOSIS — Z30011 Encounter for initial prescription of contraceptive pills: Secondary | ICD-10-CM | POA: Diagnosis not present

## 2017-05-27 MED ORDER — NORETHINDRONE 0.35 MG PO TABS: 1.0000 | ORAL_TABLET | Freq: Every day | ORAL | 3 refills | Status: DC

## 2017-05-27 NOTE — Progress Notes (Signed)
Subjective:    Michelle Arellano is a 26 y.o. 562P1011 female who presents for a postpartum visit. She is 6 weeks postpartum following a spontaneous vaginal birth at 40+6 gestational weeks. Anesthesia: epidural. I have fully reviewed the prenatal and intrapartum course.   Postpartum course has been uncomplicated. Baby's course has been uncomplicated. Baby is feeding by breast. Bleeding spotting. Bowel function is normal. Bladder function is normal.   Patient is not sexually active. Last sexual activity: prior to birth of infant. Contraception method is abstinence. Postpartum depression screening: negative. Score 0.  Last pap 09/17/2016 and was negative.  The following portions of the patient's history were reviewed and updated as appropriate: allergies, current medications, past medical history, past surgical history and problem list.  Review of Systems  Pertinent items are noted in HPI.   Objective:   BP 118/78   Pulse 69   Ht 5\' 2"  (1.575 m)   Wt 128 lb (58.1 kg)   Breastfeeding? Yes   BMI 23.41 kg/m   General:  alert, cooperative and no distress   Breasts:  deferred, no complaints  Lungs: clear to auscultation bilaterally  Heart:  regular rate and rhythm  Abdomen: soft, nontender   Vulva: normal  Vagina: normal vagina  Cervix:  closed  Corpus: Well-involuted  Adnexa:  Non-palpable        Depression screen PHQ 2/9 05/27/2017  Decreased Interest 0  Down, Depressed, Hopeless 0  PHQ - 2 Score 0  Altered sleeping 0  Tired, decreased energy 0  Change in appetite 0  Feeling bad or failure about yourself  0  Trouble concentrating 0  Moving slowly or fidgety/restless 0  Suicidal thoughts 0  PHQ-9 Score 0    Assessment:   Postpartum exam Six (6) wks s/p spontaneous vaginal birth Breastfeeding Depression screening Contraception counseling   Plan:   Rx Camila, see orders.   Reviewed red flag symptoms and when to call.   Follow up in: 6 months for annual exam or  earlier if needed.   Gunnar BullaJenkins Michelle Lawhorn, CNM

## 2017-05-27 NOTE — Patient Instructions (Signed)
Etonogestrel implant What is this medicine? ETONOGESTREL (et oh noe JES trel) is a contraceptive (birth control) device. It is used to prevent pregnancy. It can be used for up to 3 years. This medicine may be used for other purposes; ask your health care provider or pharmacist if you have questions. COMMON BRAND NAME(S): Implanon, Nexplanon What should I tell my health care provider before I take this medicine? They need to know if you have any of these conditions: -abnormal vaginal bleeding -blood vessel disease or blood clots -cancer of the breast, cervix, or liver -depression -diabetes -gallbladder disease -headaches -heart disease or recent heart attack -high blood pressure -high cholesterol -kidney disease -liver disease -renal disease -seizures -tobacco smoker -an unusual or allergic reaction to etonogestrel, other hormones, anesthetics or antiseptics, medicines, foods, dyes, or preservatives -pregnant or trying to get pregnant -breast-feeding How should I use this medicine? This device is inserted just under the skin on the inner side of your upper arm by a health care professional. Talk to your pediatrician regarding the use of this medicine in children. Special care may be needed. Overdosage: If you think you have taken too much of this medicine contact a poison control center or emergency room at once. NOTE: This medicine is only for you. Do not share this medicine with others. What if I miss a dose? This does not apply. What may interact with this medicine? Do not take this medicine with any of the following medications: -amprenavir -bosentan -fosamprenavir This medicine may also interact with the following medications: -barbiturate medicines for inducing sleep or treating seizures -certain medicines for fungal infections like ketoconazole and itraconazole -grapefruit juice -griseofulvin -medicines to treat seizures like carbamazepine, felbamate, oxcarbazepine,  phenytoin, topiramate -modafinil -phenylbutazone -rifampin -rufinamide -some medicines to treat HIV infection like atazanavir, indinavir, lopinavir, nelfinavir, tipranavir, ritonavir -St. John's wort This list may not describe all possible interactions. Give your health care provider a list of all the medicines, herbs, non-prescription drugs, or dietary supplements you use. Also tell them if you smoke, drink alcohol, or use illegal drugs. Some items may interact with your medicine. What should I watch for while using this medicine? This product does not protect you against HIV infection (AIDS) or other sexually transmitted diseases. You should be able to feel the implant by pressing your fingertips over the skin where it was inserted. Contact your doctor if you cannot feel the implant, and use a non-hormonal birth control method (such as condoms) until your doctor confirms that the implant is in place. If you feel that the implant may have broken or become bent while in your arm, contact your healthcare provider. What side effects may I notice from receiving this medicine? Side effects that you should report to your doctor or health care professional as soon as possible: -allergic reactions like skin rash, itching or hives, swelling of the face, lips, or tongue -breast lumps -changes in emotions or moods -depressed mood -heavy or prolonged menstrual bleeding -pain, irritation, swelling, or bruising at the insertion site -scar at site of insertion -signs of infection at the insertion site such as fever, and skin redness, pain or discharge -signs of pregnancy -signs and symptoms of a blood clot such as breathing problems; changes in vision; chest pain; severe, sudden headache; pain, swelling, warmth in the leg; trouble speaking; sudden numbness or weakness of the face, arm or leg -signs and symptoms of liver injury like dark yellow or brown urine; general ill feeling or flu-like symptoms;  light-colored  stools; loss of appetite; nausea; right upper belly pain; unusually weak or tired; yellowing of the eyes or skin -unusual vaginal bleeding, discharge -signs and symptoms of a stroke like changes in vision; confusion; trouble speaking or understanding; severe headaches; sudden numbness or weakness of the face, arm or leg; trouble walking; dizziness; loss of balance or coordination Side effects that usually do not require medical attention (report to your doctor or health care professional if they continue or are bothersome): -acne -back pain -breast pain -changes in weight -dizziness -general ill feeling or flu-like symptoms -headache -irregular menstrual bleeding -nausea -sore throat -vaginal irritation or inflammation This list may not describe all possible side effects. Call your doctor for medical advice about side effects. You may report side effects to FDA at 1-800-FDA-1088. Where should I keep my medicine? This drug is given in a hospital or clinic and will not be stored at home. NOTE: This sheet is a summary. It may not cover all possible information. If you have questions about this medicine, talk to your doctor, pharmacist, or health care provider.  2018 Elsevier/Gold Standard (2016-01-15 11:19:22) Preventive Care 18-39 Years, Female Preventive care refers to lifestyle choices and visits with your health care provider that can promote health and wellness. What does preventive care include?  A yearly physical exam. This is also called an annual well check.  Dental exams once or twice a year.  Routine eye exams. Ask your health care provider how often you should have your eyes checked.  Personal lifestyle choices, including: ? Daily care of your teeth and gums. ? Regular physical activity. ? Eating a healthy diet. ? Avoiding tobacco and drug use. ? Limiting alcohol use. ? Practicing safe sex. ? Taking vitamin and mineral supplements as recommended by your  health care provider. What happens during an annual well check? The services and screenings done by your health care provider during your annual well check will depend on your age, overall health, lifestyle risk factors, and family history of disease. Counseling Your health care provider may ask you questions about your:  Alcohol use.  Tobacco use.  Drug use.  Emotional well-being.  Home and relationship well-being.  Sexual activity.  Eating habits.  Work and work Statistician.  Method of birth control.  Menstrual cycle.  Pregnancy history.  Screening You may have the following tests or measurements:  Height, weight, and BMI.  Diabetes screening. This is done by checking your blood sugar (glucose) after you have not eaten for a while (fasting).  Blood pressure.  Lipid and cholesterol levels. These may be checked every 5 years starting at age 72.  Skin check.  Hepatitis C blood test.  Hepatitis B blood test.  Sexually transmitted disease (STD) testing.  BRCA-related cancer screening. This may be done if you have a family history of breast, ovarian, tubal, or peritoneal cancers.  Pelvic exam and Pap test. This may be done every 3 years starting at age 49. Starting at age 3, this may be done every 5 years if you have a Pap test in combination with an HPV test.  Discuss your test results, treatment options, and if necessary, the need for more tests with your health care provider. Vaccines Your health care provider may recommend certain vaccines, such as:  Influenza vaccine. This is recommended every year.  Tetanus, diphtheria, and acellular pertussis (Tdap, Td) vaccine. You may need a Td booster every 10 years.  Varicella vaccine. You may need this if you have not been vaccinated.  HPV vaccine. If you are 46 or younger, you may need three doses over 6 months.  Measles, mumps, and rubella (MMR) vaccine. You may need at least one dose of MMR. You may also need  a second dose.  Pneumococcal 13-valent conjugate (PCV13) vaccine. You may need this if you have certain conditions and were not previously vaccinated.  Pneumococcal polysaccharide (PPSV23) vaccine. You may need one or two doses if you smoke cigarettes or if you have certain conditions.  Meningococcal vaccine. One dose is recommended if you are age 23-21 years and a first-year college student living in a residence hall, or if you have one of several medical conditions. You may also need additional booster doses.  Hepatitis A vaccine. You may need this if you have certain conditions or if you travel or work in places where you may be exposed to hepatitis A.  Hepatitis B vaccine. You may need this if you have certain conditions or if you travel or work in places where you may be exposed to hepatitis B.  Haemophilus influenzae type b (Hib) vaccine. You may need this if you have certain risk factors.  Talk to your health care provider about which screenings and vaccines you need and how often you need them. This information is not intended to replace advice given to you by your health care provider. Make sure you discuss any questions you have with your health care provider. Document Released: 08/24/2001 Document Revised: 03/17/2016 Document Reviewed: 04/29/2015 Elsevier Interactive Patient Education  2017 [employer].

## 2017-05-29 ENCOUNTER — Encounter: Payer: Self-pay | Admitting: Certified Nurse Midwife

## 2017-07-21 ENCOUNTER — Encounter: Payer: Self-pay | Admitting: Certified Nurse Midwife

## 2017-07-21 ENCOUNTER — Ambulatory Visit (INDEPENDENT_AMBULATORY_CARE_PROVIDER_SITE_OTHER): Payer: Medicaid Other | Admitting: Certified Nurse Midwife

## 2017-07-21 VITALS — BP 125/85 | HR 76 | Wt 116.6 lb

## 2017-07-21 DIAGNOSIS — Z30017 Encounter for initial prescription of implantable subdermal contraceptive: Secondary | ICD-10-CM

## 2017-07-21 LAB — POCT URINE PREGNANCY: PREG TEST UR: NEGATIVE

## 2017-07-21 MED ORDER — ETONOGESTREL 68 MG ~~LOC~~ IMPL
68.0000 mg | DRUG_IMPLANT | Freq: Once | SUBCUTANEOUS | Status: AC
  Administered 2017-07-21: 68 mg via SUBCUTANEOUS

## 2017-07-21 NOTE — Progress Notes (Signed)
Jarold Songlyssa G Kaps is a 27 y.o. year old Asian female here for Nexplanon insertion.  Patient's last menstrual period was 06/24/2017 (approximate)., last sexual intercourse was three (3) weeks ago, and her pregnancy test today was negative.    Risks/benefits/side effects of Nexplanon have been discussed and her questions have been answered.  Specifically, a failure rate of 07/998 has been reported, with an increased failure rate if pt takes St. John's Wort and/or antiseizure medicaitons.    Mckenna G Bastedo is aware of the common side effect of irregular bleeding, which the incidence of decreases over time.  BP 125/85   Pulse 76   Wt 116 lb 9.6 oz (52.9 kg)   LMP 06/24/2017 (Approximate)   BMI 21.33 kg/m   No results found for this or any previous visit (from the past 24 hour(s)).   She is right-handed, so her left arm, approximately 4 inches proximal from the elbow, was cleansed with alcohol and anesthetized with 2cc of 2% Lidocaine.  The area was cleansed again with betadine and the Nexplanon was inserted per manufacturer's recommendations without difficulty.  A steri-strip and pressure bandage were applied.  Pt was instructed to keep the area clean and dry, remove pressure bandage in 24 hours, and keep insertion site covered with the steri-strip for 3-5 days.  Back up contraception was recommended for 2 weeks.  She was given a card indicating date Nexplanon was inserted and date it needs to be removed.   Follow-up PRN problems.   Gunnar BullaJenkins Michelle Anyeli [place], CNM [facility], [facility]

## 2017-07-21 NOTE — Patient Instructions (Signed)
Etonogestrel implant What is this medicine? ETONOGESTREL (et oh noe JES trel) is a contraceptive (birth control) device. It is used to prevent pregnancy. It can be used for up to 3 years. This medicine may be used for other purposes; ask your health care provider or pharmacist if you have questions. COMMON BRAND NAME(S): Implanon, Nexplanon What should I tell my health care provider before I take this medicine? They need to know if you have any of these conditions: -abnormal vaginal bleeding -blood vessel disease or blood clots -cancer of the breast, cervix, or liver -depression -diabetes -gallbladder disease -headaches -heart disease or recent heart attack -high blood pressure -high cholesterol -kidney disease -liver disease -renal disease -seizures -tobacco smoker -an unusual or allergic reaction to etonogestrel, other hormones, anesthetics or antiseptics, medicines, foods, dyes, or preservatives -pregnant or trying to get pregnant -breast-feeding How should I use this medicine? This device is inserted just under the skin on the inner side of your upper arm by a health care professional. Talk to your pediatrician regarding the use of this medicine in children. Special care may be needed. Overdosage: If you think you have taken too much of this medicine contact a poison control center or emergency room at once. NOTE: This medicine is only for you. Do not share this medicine with others. What if I miss a dose? This does not apply. What may interact with this medicine? Do not take this medicine with any of the following medications: -amprenavir -bosentan -fosamprenavir This medicine may also interact with the following medications: -barbiturate medicines for inducing sleep or treating seizures -certain medicines for fungal infections like ketoconazole and itraconazole -grapefruit juice -griseofulvin -medicines to treat seizures like carbamazepine, felbamate, oxcarbazepine,  phenytoin, topiramate -modafinil -phenylbutazone -rifampin -rufinamide -some medicines to treat HIV infection like atazanavir, indinavir, lopinavir, nelfinavir, tipranavir, ritonavir -St. John's wort This list may not describe all possible interactions. Give your health care provider a list of all the medicines, herbs, non-prescription drugs, or dietary supplements you use. Also tell them if you smoke, drink alcohol, or use illegal drugs. Some items may interact with your medicine. What should I watch for while using this medicine? This product does not protect you against HIV infection (AIDS) or other sexually transmitted diseases. You should be able to feel the implant by pressing your fingertips over the skin where it was inserted. Contact your doctor if you cannot feel the implant, and use a non-hormonal birth control method (such as condoms) until your doctor confirms that the implant is in place. If you feel that the implant may have broken or become bent while in your arm, contact your healthcare provider. What side effects may I notice from receiving this medicine? Side effects that you should report to your doctor or health care professional as soon as possible: -allergic reactions like skin rash, itching or hives, swelling of the face, lips, or tongue -breast lumps -changes in emotions or moods -depressed mood -heavy or prolonged menstrual bleeding -pain, irritation, swelling, or bruising at the insertion site -scar at site of insertion -signs of infection at the insertion site such as fever, and skin redness, pain or discharge -signs of pregnancy -signs and symptoms of a blood clot such as breathing problems; changes in vision; chest pain; severe, sudden headache; pain, swelling, warmth in the leg; trouble speaking; sudden numbness or weakness of the face, arm or leg -signs and symptoms of liver injury like dark yellow or brown urine; general ill feeling or flu-like symptoms;  light-colored   stools; loss of appetite; nausea; right upper belly pain; unusually weak or tired; yellowing of the eyes or skin -unusual vaginal bleeding, discharge -signs and symptoms of a stroke like changes in vision; confusion; trouble speaking or understanding; severe headaches; sudden numbness or weakness of the face, arm or leg; trouble walking; dizziness; loss of balance or coordination Side effects that usually do not require medical attention (report to your doctor or health care professional if they continue or are bothersome): -acne -back pain -breast pain -changes in weight -dizziness -general ill feeling or flu-like symptoms -headache -irregular menstrual bleeding -nausea -sore throat -vaginal irritation or inflammation This list may not describe all possible side effects. Call your doctor for medical advice about side effects. You may report side effects to FDA at 1-800-FDA-1088. Where should I keep my medicine? This drug is given in a hospital or clinic and will not be stored at home. NOTE: This sheet is a summary. It may not cover all possible information. If you have questions about this medicine, talk to your doctor, pharmacist, or health care provider.  2018 Elsevier/Gold Standard (2016-01-15 11:19:22) Nexplanon Instructions After Insertion   Keep bandage clean and dry for 24 hours   May use ice/Tylenol/Ibuprofen for soreness or pain   If you develop fever, drainage or increased warmth from incision site-contact office immediately   

## 2017-07-21 NOTE — Progress Notes (Signed)
Patient is here to have the Nexplanon insertion.

## 2017-07-21 NOTE — Addendum Note (Signed)
Addended by: Verdis PrimePRESLEY, Cailean Heacock L on: 07/21/2017 03:46 PM   Modules accepted: Orders

## 2019-04-23 ENCOUNTER — Other Ambulatory Visit: Payer: Self-pay

## 2019-04-23 ENCOUNTER — Ambulatory Visit (INDEPENDENT_AMBULATORY_CARE_PROVIDER_SITE_OTHER): Payer: Medicaid Other | Admitting: Certified Nurse Midwife

## 2019-04-23 ENCOUNTER — Encounter: Payer: Self-pay | Admitting: Certified Nurse Midwife

## 2019-04-23 VITALS — BP 109/78 | HR 79 | Ht 62.0 in | Wt 132.6 lb

## 2019-04-23 DIAGNOSIS — Z3046 Encounter for surveillance of implantable subdermal contraceptive: Secondary | ICD-10-CM

## 2019-04-23 DIAGNOSIS — Z3049 Encounter for surveillance of other contraceptives: Secondary | ICD-10-CM

## 2019-04-23 NOTE — Patient Instructions (Addendum)
Levonorgestrel intrauterine device (IUD) What is this medicine? LEVONORGESTREL IUD (LEE voe nor jes trel) is a contraceptive (birth control) device. The device is placed inside the uterus by a healthcare professional. It is used to prevent pregnancy. This device can also be used to treat heavy bleeding that occurs during your period. This medicine may be used for other purposes; ask your health care provider or pharmacist if you have questions. COMMON BRAND NAME(S): Kyleena, LILETTA, Mirena, Skyla What should I tell my health care provider before I take this medicine? They need to know if you have any of these conditions:  abnormal Pap smear  cancer of the breast, uterus, or cervix  diabetes  endometritis  genital or pelvic infection now or in the past  have more than one sexual partner or your partner has more than one partner  heart disease  history of an ectopic or tubal pregnancy  immune system problems  IUD in place  liver disease or tumor  problems with blood clots or take blood-thinners  seizures  use intravenous drugs  uterus of unusual shape  vaginal bleeding that has not been explained  an unusual or allergic reaction to levonorgestrel, other hormones, silicone, or polyethylene, medicines, foods, dyes, or preservatives  pregnant or trying to get pregnant  breast-feeding How should I use this medicine? This device is placed inside the uterus by a health care professional. Talk to your pediatrician regarding the use of this medicine in children. Special care may be needed. Overdosage: If you think you have taken too much of this medicine contact a poison control center or emergency room at once. NOTE: This medicine is only for you. Do not share this medicine with others. What if I miss a dose? This does not apply. Depending on the brand of device you have inserted, the device will need to be replaced every 3 to 6 years if you wish to continue using this type  of birth control. What may interact with this medicine? Do not take this medicine with any of the following medications:  amprenavir  bosentan  fosamprenavir This medicine may also interact with the following medications:  aprepitant  armodafinil  barbiturate medicines for inducing sleep or treating seizures  bexarotene  boceprevir  griseofulvin  medicines to treat seizures like carbamazepine, ethotoin, felbamate, oxcarbazepine, phenytoin, topiramate  modafinil  pioglitazone  rifabutin  rifampin  rifapentine  some medicines to treat HIV infection like atazanavir, efavirenz, indinavir, lopinavir, nelfinavir, tipranavir, ritonavir  St. John's wort  warfarin This list may not describe all possible interactions. Give your health care provider a list of all the medicines, herbs, non-prescription drugs, or dietary supplements you use. Also tell them if you smoke, drink alcohol, or use illegal drugs. Some items may interact with your medicine. What should I watch for while using this medicine? Visit your doctor or health care professional for regular check ups. See your doctor if you or your partner has sexual contact with others, becomes HIV positive, or gets a sexual transmitted disease. This product does not protect you against HIV infection (AIDS) or other sexually transmitted diseases. You can check the placement of the IUD yourself by reaching up to the top of your vagina with clean fingers to feel the threads. Do not pull on the threads. It is a good habit to check placement after each menstrual period. Call your doctor right away if you feel more of the IUD than just the threads or if you cannot feel the threads at   all. The IUD may come out by itself. You may become pregnant if the device comes out. If you notice that the IUD has come out use a backup birth control method like condoms and call your health care provider. Using tampons will not change the position of the  IUD and are okay to use during your period. This IUD can be safely scanned with magnetic resonance imaging (MRI) only under specific conditions. Before you have an MRI, tell your healthcare provider that you have an IUD in place, and which type of IUD you have in place. What side effects may I notice from receiving this medicine? Side effects that you should report to your doctor or health care professional as soon as possible:  allergic reactions like skin rash, itching or hives, swelling of the face, lips, or tongue  fever, flu-like symptoms  genital sores  high blood pressure  no menstrual period for 6 weeks during use  pain, swelling, warmth in the leg  pelvic pain or tenderness  severe or sudden headache  signs of pregnancy  stomach cramping  sudden shortness of breath  trouble with balance, talking, or walking  unusual vaginal bleeding, discharge  yellowing of the eyes or skin Side effects that usually do not require medical attention (report to your doctor or health care professional if they continue or are bothersome):  acne  breast pain  change in sex drive or performance  changes in weight  cramping, dizziness, or faintness while the device is being inserted  headache  irregular menstrual bleeding within first 3 to 6 months of use  nausea This list may not describe all possible side effects. Call your doctor for medical advice about side effects. You may report side effects to FDA at 1-800-FDA-1088. Where should I keep my medicine? This does not apply. NOTE: This sheet is a summary. It may not cover all possible information. If you have questions about this medicine, talk to your doctor, pharmacist, or health care provider.  2020 Elsevier/Gold Standard (2018-05-09 13:22:01)   Nexplanon Instructions After Insertion or Removal   Keep bandage clean and dry for 24 hours   May use ice/Tylenol/Ibuprofen for soreness or pain   If you develop fever,  drainage or increased warmth from incision site-contact office immediately    Preventive Care 36-65 Years Old, Female Preventive care refers to visits with your health care provider and lifestyle choices that can promote health and wellness. This includes:  A yearly physical exam. This may also be called an annual well check.  Regular dental visits and eye exams.  Immunizations.  Screening for certain conditions.  Healthy lifestyle choices, such as eating a healthy diet, getting regular exercise, not using drugs or products that contain nicotine and tobacco, and limiting alcohol use. What can I expect for my preventive care visit? Physical exam Your health care provider will check your:  Height and weight. This may be used to calculate body mass index (BMI), which tells if you are at a healthy weight.  Heart rate and blood pressure.  Skin for abnormal spots. Counseling Your health care provider may ask you questions about your:  Alcohol, tobacco, and drug use.  Emotional well-being.  Home and relationship well-being.  Sexual activity.  Eating habits.  Work and work Statistician.  Method of birth control.  Menstrual cycle.  Pregnancy history. What immunizations do I need?  Influenza (flu) vaccine  This is recommended every year. Tetanus, diphtheria, and pertussis (Tdap) vaccine  You may need  a Td booster every 10 years. Varicella (chickenpox) vaccine  You may need this if you have not been vaccinated. Human papillomavirus (HPV) vaccine  If recommended by your health care provider, you may need three doses over 6 months. Measles, mumps, and rubella (MMR) vaccine  You may need at least one dose of MMR. You may also need a second dose. Meningococcal conjugate (MenACWY) vaccine  One dose is recommended if you are age 70-21 years and a first-year college student living in a residence hall, or if you have one of several medical conditions. You may also need  additional booster doses. Pneumococcal conjugate (PCV13) vaccine  You may need this if you have certain conditions and were not previously vaccinated. Pneumococcal polysaccharide (PPSV23) vaccine  You may need one or two doses if you smoke cigarettes or if you have certain conditions. Hepatitis A vaccine  You may need this if you have certain conditions or if you travel or work in places where you may be exposed to hepatitis A. Hepatitis B vaccine  You may need this if you have certain conditions or if you travel or work in places where you may be exposed to hepatitis B. Haemophilus influenzae type b (Hib) vaccine  You may need this if you have certain conditions. You may receive vaccines as individual doses or as more than one vaccine together in one shot (combination vaccines). Talk with your health care provider about the risks and benefits of combination vaccines. What tests do I need?  Blood tests  Lipid and cholesterol levels. These may be checked every 5 years starting at age 73.  Hepatitis C test.  Hepatitis B test. Screening  Diabetes screening. This is done by checking your blood sugar (glucose) after you have not eaten for a while (fasting).  Sexually transmitted disease (STD) testing.  BRCA-related cancer screening. This may be done if you have a family history of breast, ovarian, tubal, or peritoneal cancers.  Pelvic exam and Pap test. This may be done every 3 years starting at age 27. Starting at age 81, this may be done every 5 years if you have a Pap test in combination with an HPV test. Talk with your health care provider about your test results, treatment options, and if necessary, the need for more tests. Follow these instructions at home: Eating and drinking   Eat a diet that includes fresh fruits and vegetables, whole grains, lean protein, and low-fat dairy.  Take vitamin and mineral supplements as recommended by your health care provider.  Do not  drink alcohol if: ? Your health care provider tells you not to drink. ? You are pregnant, may be pregnant, or are planning to become pregnant.  If you drink alcohol: ? Limit how much you have to 0-1 drink a day. ? Be aware of how much alcohol is in your drink. In the [place] one drink equals one 12 oz bottle of beer (355 mL), one 5 oz glass of wine (148 mL), or one 1 oz glass of hard liquor (44 mL). Lifestyle  Take daily care of your teeth and gums.  Stay active. Exercise for at least 30 minutes on 5 or more days each week.  Do not use any products that contain nicotine or tobacco, such as cigarettes, e-cigarettes, and chewing tobacco. If you need help quitting, ask your health care provider.  If you are sexually active, practice safe sex. Use a condom or other form of birth control (contraception) in order to prevent pregnancy  and STIs (sexually transmitted infections). If you plan to become pregnant, see your health care provider for a preconception visit. What's next?  Visit your health care provider once a year for a well check visit.  Ask your health care provider how often you should have your eyes and teeth checked.  Stay up to date on all vaccines. This information is not intended to replace advice given to you by your health care provider. Make sure you discuss any questions you have with your health care provider. Document Released: 08/24/2001 Document Revised: 03/09/2018 Document Reviewed: 03/09/2018 Elsevier Patient Education  2020 [employer].

## 2019-04-23 NOTE — Progress Notes (Signed)
Michelle Arellano is a 28 y.o. year old Asian female here for Nexplanon removal.  Patient given informed consent for removal of her Nexplanon.  BP 109/78   Pulse 79   Ht 5\' 2"  (1.575 m)   Wt 132 lb 9.6 oz (60.1 kg)   LMP  (LMP Unknown)   Breastfeeding No   BMI 24.25 kg/m   Appropriate time out taken. Nexplanon site identified.  Area prepped in usual sterile fashon. One cc of 2% lidocaine was used to anesthetize the area at the distal end of the implant. A small stab incision was made right beside the implant on the distal portion.  The Nexplanon rod was grasped using hemostats and removed without difficulty.  There was less than 3 cc blood loss. There were no complications.  Steri-strips were applied over the small incision and a pressure bandage was applied.  The patient tolerated the procedure well.  She was instructed to keep the area clean and dry, remove pressure bandage in 24 hours, and keep insertion site covered with the steri-strip for 3-5 days.    Reviewed red flag symptoms and when to call.   RTC for ParaGard insertion at start of menses or sooner if needed.    Diona Fanti, CNM [facility], [facility] 04/23/19 3:05 PM

## 2019-05-24 ENCOUNTER — Other Ambulatory Visit: Payer: Self-pay

## 2019-05-24 ENCOUNTER — Encounter: Payer: Self-pay | Admitting: Certified Nurse Midwife

## 2019-05-24 ENCOUNTER — Ambulatory Visit (INDEPENDENT_AMBULATORY_CARE_PROVIDER_SITE_OTHER): Payer: Medicaid Other | Admitting: Certified Nurse Midwife

## 2019-05-24 VITALS — BP 92/65 | HR 76 | Ht 61.0 in | Wt 131.2 lb

## 2019-05-24 DIAGNOSIS — Z3202 Encounter for pregnancy test, result negative: Secondary | ICD-10-CM

## 2019-05-24 DIAGNOSIS — Z30430 Encounter for insertion of intrauterine contraceptive device: Secondary | ICD-10-CM | POA: Diagnosis not present

## 2019-05-24 LAB — POCT URINE PREGNANCY: Preg Test, Ur: NEGATIVE

## 2019-05-24 NOTE — Progress Notes (Signed)
Patient here for Paragard IUD insertion.

## 2019-05-24 NOTE — Patient Instructions (Addendum)
IUD PLACEMENT POST-PROCEDURE INSTRUCTIONS  1. You may take Ibuprofen, Aleve or Tylenol for pain if needed.  Cramping should resolve within in 24 hours.  2. You may have a small amount of spotting.  You should wear a mini pad for the next few days.  3. You may have intercourse after 24 hours.  If you using this for birth control, it is effective immediately.  4. You need to call if you have any pelvic pain, fever, heavy bleeding or foul smelling vaginal discharge.  Irregular bleeding is common the first several months after having an IUD placed. You do not need to call for this reason unless you are concerned.  5. Shower or bathe as normal  6. You should have a follow-up appointment in 4-8 weeks for a re-check to make sure you are not having any problems.     Intrauterine Device Insertion, Care After  This sheet gives you information about how to care for yourself after your procedure. Your health care provider may also give you more specific instructions. If you have problems or questions, contact your health care provider. What can I expect after the procedure? After the procedure, it is common to have:  Cramps and pain in the abdomen.  Light bleeding (spotting) or heavier bleeding that is like your menstrual period. This may last for up to a few days.  Lower back pain.  Dizziness.  Headaches.  Nausea. Follow these instructions at home:  Before resuming sexual activity, check to make sure that you can feel the IUD string(s). You should be able to feel the end of the string(s) below the opening of your cervix. If your IUD string is in place, you may resume sexual activity. ? If you had a hormonal IUD inserted more than 7 days after your most recent period started, you will need to use a backup method of birth control for 7 days after IUD insertion. Ask your health care provider whether this applies to you.  Continue to check that the IUD is still in place by feeling for the  string(s) after every menstrual period, or once a month.  Take over-the-counter and prescription medicines only as told by your health care provider.  Do not drive or use heavy machinery while taking prescription pain medicine.  Keep all follow-up visits as told by your health care provider. This is important. Contact a health care provider if:  You have bleeding that is heavier or lasts longer than a normal menstrual cycle.  You have a fever.  You have cramps or abdominal pain that get worse or do not get better with medicine.  You develop abdominal pain that is new or is not in the same area of earlier cramping and pain.  You feel lightheaded or weak.  You have abnormal or bad-smelling discharge from your vagina.  You have pain during sexual activity.  You have any of the following problems with your IUD string(s): ? The string bothers or hurts you or your sexual partner. ? You cannot feel the string. ? The string has gotten longer.  You can feel the IUD in your vagina.  You think you may be pregnant, or you miss your menstrual period.  You think you may have an STI (sexually transmitted infection). Get help right away if:  You have flu-like symptoms.  You have a fever and chills.  You can feel that your IUD has slipped out of place. Summary  After the procedure, it is common to have  cramps and pain in the abdomen. It is also common to have light bleeding (spotting) or heavier bleeding that is like your menstrual period.  Continue to check that the IUD is still in place by feeling for the string(s) after every menstrual period, or once a month.  Keep all follow-up visits as told by your health care provider. This is important.  Contact your health care provider if you have problems with your IUD string(s), such as the string getting longer or bothering you or your sexual partner. This information is not intended to replace advice given to you by your health care  provider. Make sure you discuss any questions you have with your health care provider. Document Released: 02/24/2011 Document Revised: 06/10/2017 Document Reviewed: 05/19/2016 Elsevier Patient Education  2020 [employer].

## 2019-05-24 NOTE — Progress Notes (Signed)
Michelle Arellano is a 28 y.o. year old [id number] Asian female who presents for placement of a Paragard IUD.  BP 92/65   Pulse 76   Ht 5\' 1"  (1.549 m)   Wt 131 lb 3.2 oz (59.5 kg)   LMP  (LMP Unknown)   BMI 24.79 kg/m    Pregnancy test today was negative.  The risks and benefits of the method and placement have been thouroughly reviewed with the patient and all questions were answered.  Specifically the patient is aware of failure rate of 07/998, expulsion of the IUD and of possible perforation.  The patient is aware of heavy menses with increased cramps associated with this method.  Signed copy of informed consent in chart.   Time out was performed.  A small plastic speculum was placed in the vagina.  The cervix was visualized, prepped using Betadine, and grasped with a single tooth tenaculum. The uterus was sounded to 9 cm.  Paragard IUD placed per manufacturer's recommendations.   The strings were trimmed to 3 cm.  The patient was given post procedure instructions, including signs and symptoms of infection and to check for the strings after each menses or each month, and refraining from intercourse or anything in the vagina for 3 days.  She was given a Pargard care card with date Paragard placed, and date Paragard to be removed.  Reviewed red flag symptoms and when to call.   RTC x 6-8 weeks for IUD check; RTC x 6 months for ANNUAL EXAM and PAP or sooner if needed   Diona Fanti, CNM [facility], [facility] 05/24/19 3:02 PM   [place]: [id number] Lot: [id number] Exp: 01/2025

## 2019-07-10 ENCOUNTER — Encounter: Payer: Self-pay | Admitting: Obstetrics and Gynecology

## 2019-07-10 ENCOUNTER — Other Ambulatory Visit: Payer: Self-pay

## 2019-07-10 ENCOUNTER — Ambulatory Visit (INDEPENDENT_AMBULATORY_CARE_PROVIDER_SITE_OTHER): Payer: Medicaid Other | Admitting: Obstetrics and Gynecology

## 2019-07-10 VITALS — BP 127/77 | HR 86 | Ht 61.0 in | Wt 134.1 lb

## 2019-07-10 DIAGNOSIS — Z30431 Encounter for routine checking of intrauterine contraceptive device: Secondary | ICD-10-CM | POA: Diagnosis not present

## 2019-07-10 NOTE — Progress Notes (Signed)
HPI:      [name] is a 28 y.o. G2P1011 who LMP was No LMP recorded. (Menstrual status: IUD).  Subjective:   She presents today to discuss her IUD.  She feels as if she can feel a thickened hard string in the vagina almost feels "like copper".  She would like the IUD checked. Reports no problems with intercourse. She has not had unusual bleeding.    Hx: The following portions of the patient's history were reviewed and updated as appropriate:             She  has a past medical history of Bell's palsy. She does not have any pertinent problems on file. She  has a past surgical history that includes cyst removed and Wisdom tooth extraction. Her family history includes Diabetes in her mother; Heart disease in her maternal grandmother; Heart failure in her maternal grandfather; Lung cancer in her maternal grandfather. She  reports that she has never smoked. She has never used smokeless tobacco. She reports current alcohol use. She reports current drug use. Frequency: 1.00 time per week. Drug: Marijuana. She currently has no medications in their medication list. She has No Known Allergies.       Review of Systems:  Review of Systems  Constitutional: Denied constitutional symptoms, night sweats, recent illness, fatigue, fever, insomnia and weight loss.  Eyes: Denied eye symptoms, eye pain, photophobia, vision change and visual disturbance.  Ears/Nose/Throat/Neck: Denied ear, nose, throat or neck symptoms, hearing loss, nasal discharge, sinus congestion and sore throat.  Cardiovascular: Denied cardiovascular symptoms, arrhythmia, chest pain/pressure, edema, exercise intolerance, orthopnea and palpitations.  Respiratory: Denied pulmonary symptoms, asthma, pleuritic pain, productive sputum, cough, dyspnea and wheezing.  Gastrointestinal: Denied, gastro-esophageal reflux, melena, nausea and vomiting.  Genitourinary: Denied genitourinary symptoms including symptomatic vaginal discharge,  pelvic relaxation issues, and urinary complaints.  Musculoskeletal: Denied musculoskeletal symptoms, stiffness, swelling, muscle weakness and myalgia.  Dermatologic: Denied dermatology symptoms, rash and scar.  Neurologic: Denied neurology symptoms, dizziness, headache, neck pain and syncope.  Psychiatric: Denied psychiatric symptoms, anxiety and depression.  Endocrine: Denied endocrine symptoms including hot flashes and night sweats.   Meds:   No current outpatient medications on file prior to visit.   No current facility-administered medications on file prior to visit.    Objective:     Vitals:   07/10/19 1118  BP: 127/77  Pulse: 86              Physical examination   Pelvic:   Vulva: Normal appearance.  No lesions.  Vagina: No lesions or abnormalities noted.  Support: Normal pelvic support.  Urethra No masses tenderness or scarring.  Meatus Normal size without lesions or prolapse.  Cervix: Normal appearance.  No lesions. IUD strings noted at cervical os 1 of which is very long.  Shortened with scissors.  Using a Q-tip I could not identify any part of the IUD within the cervical os.  Anus: Normal exam.  No lesions.  Perineum: Normal exam.  No lesions.        Bimanual   Uterus: Normal size.  Non-tender.  Mobile.  AV.  Adnexae: No masses.  Non-tender to palpation.  Cul-de-sac: Negative for abnormality.     Assessment:    G2P1011 There are no problems to display for this patient.    1. Surveillance of previously prescribed intrauterine contraceptive device     IUD likely positioned correctly and patient was probably just feeling one of her IUD strings.  Plan:            1.  Expectant management.  If patient continues to feel something hard at her cervix or if patient has irregular bleeding (not regular menses) consider ultrasound for positioning of IUD. Orders No orders of the defined types were placed in this encounter.   No orders of the defined types were  placed in this encounter.     F/U  Return for Pt to contact us if symptoms worsen. I spent 17 minutes involved in the care of this patient of which greater than 50% was spent discussing IUD issues including placement, constant spotting, pain with intercourse.  Patient feels reassured all questions answered.  Elonda Husky, M.D. 07/10/2019 11:44 AM

## 2019-09-24 ENCOUNTER — Other Ambulatory Visit: Payer: Self-pay

## 2019-09-24 ENCOUNTER — Ambulatory Visit (INDEPENDENT_AMBULATORY_CARE_PROVIDER_SITE_OTHER): Payer: Medicaid Other | Admitting: Certified Nurse Midwife

## 2019-09-24 ENCOUNTER — Encounter: Payer: Self-pay | Admitting: Certified Nurse Midwife

## 2019-09-24 ENCOUNTER — Other Ambulatory Visit (HOSPITAL_COMMUNITY)
Admission: RE | Admit: 2019-09-24 | Discharge: 2019-09-24 | Disposition: A | Discharge status: Home / Self Care | Payer: Medicaid Other | Source: Ambulatory Visit | Attending: Certified Nurse Midwife | Admitting: Certified Nurse Midwife

## 2019-09-24 VITALS — BP 104/69 | HR 76 | Ht 61.0 in | Wt 135.1 lb

## 2019-09-24 DIAGNOSIS — Z01419 Encounter for gynecological examination (general) (routine) without abnormal findings: Secondary | ICD-10-CM

## 2019-09-24 DIAGNOSIS — Z975 Presence of (intrauterine) contraceptive device: Secondary | ICD-10-CM

## 2019-09-24 DIAGNOSIS — Z124 Encounter for screening for malignant neoplasm of cervix: Secondary | ICD-10-CM | POA: Insufficient documentation

## 2019-09-24 DIAGNOSIS — Z23 Encounter for immunization: Secondary | ICD-10-CM

## 2019-09-24 NOTE — Progress Notes (Signed)
ANNUAL PREVENTATIVE CARE GYN  ENCOUNTER NOTE  Subjective:       Michelle Arellano is a 29 y.o. G41P1011 female here for a routine annual gynecologic exam.  Denies difficulty breathing or respiratory distress, chest pain, abdominal pain, excessive vaginal bleeding, dysuria, and leg pain or swelling.    Gynecologic History  Patient's last menstrual period was 09/01/2019 (within days). Period Cycle (Days): 28 Period Duration (Days): Three (3) to four (4) Period Pattern: Regular Menstrual Flow: Moderate Menstrual Control: Other (Comment)(Menstrual cup) Menstrual Control Change Freq (Hours): 12 Dysmenorrhea: None  Contraception: IUD, Paragard  Last Pap: 09/2016. Results were: normal  Obstetric History  OB History  Gravida Para Term Preterm AB Living  2 1 1   1 1   SAB TAB Ectopic Multiple Live Births    1   0 1    # Outcome Date GA Lbr Len/2nd Weight Sex Delivery Anes PTL Lv  2 Term 04/15/17 [redacted]w[redacted]d 02:43 / 06:54 6 lb 10 oz (3.005 kg) F Vag-Spont EPI  LIV  1 TAB 2006            Past Medical History:  Diagnosis Date  . Bell's palsy     Past Surgical History:  Procedure Laterality Date  . cyst removed     rt arm  . WISDOM TOOTH EXTRACTION      No Known Allergies  Social History   Socioeconomic History  . Marital status: Single    Spouse name: Not on file  . Number of children: Not on file  . Years of education: Not on file  . Highest education level: Not on file  Occupational History  . Not on file  Tobacco Use  . Smoking status: Never Smoker  . Smokeless tobacco: Never Used  Substance and Sexual Activity  . Alcohol use: Yes    Comment: wine occasionally   . Drug use: Yes    Frequency: 1.0 times per week    Types: Marijuana  . Sexual activity: Yes    Partners: Male    Birth control/protection: IUD, Paragard  Other Topics Concern  . Not on file  Social History Narrative  . Not on file   Social Determinants of Health   Financial Resource Strain:   .  Difficulty of Paying Living Expenses:   Food Insecurity:   . Worried About 2007 in the Last Year:   . Programme researcher, broadcasting/film/video in the Last Year:   Transportation Needs:   . Barista (Medical):   Freight forwarder Lack of Transportation (Non-Medical):   Physical Activity:   . Days of Exercise per Week:   . Minutes of Exercise per Session:   Stress:   . Feeling of Stress :   Social Connections:   . Frequency of Communication with Friends and Family:   . Frequency of Social Gatherings with Friends and Family:   . Attends Religious Services:   . Active Member of Clubs or Organizations:   . Attends [employer] Meetings:   Banker Marital Status:   Intimate Partner Violence:   . Fear of Current or Ex-Partner:   . Emotionally Abused:   [employer] [employer] Physically Abused:   . Sexually Abused:     Family History  Problem Relation Age of Onset  . Diabetes Mother   . Heart disease Maternal Grandmother   . Heart failure Maternal Grandfather   . Lung cancer Maternal Grandfather   . Asthma Neg Hx   . Ovarian cancer Neg Hx   .  Colon cancer Neg Hx   . Breast cancer Neg Hx     The following portions of the patient's history were reviewed and updated as appropriate: allergies, current medications, past family history, past medical history, past social history, past surgical history and problem list.  Review of Systems  ROS negative except as noted above. Information obtained from patient.    Objective:   BP 104/69   Pulse 76   Ht 5\' 1"  (1.549 m)   Wt 135 lb 2 oz (61.3 kg)   LMP 09/01/2019 (Within Days)   BMI 25.53 kg/m    CONSTITUTIONAL: Well-developed, well-nourished female in no acute distress.   PSYCHIATRIC: Normal mood and affect. Normal behavior. Normal judgment and thought content.  [place]: Alert and oriented to person, place, and time. Normal muscle tone coordination. No cranial nerve deficit noted.  HENT:  Normocephalic, atraumatic, External right and left ear normal.    EYES: Conjunctivae and EOM are normal. Pupils are equal and round.    NECK: Normal range of motion, supple, no masses.  Normal thyroid.   SKIN: Skin is warm and dry. No rash noted. Not diaphoretic. No erythema. No pallor. Professional tattoos present.   CARDIOVASCULAR: Normal heart rate noted, regular rhythm, no murmur.  RESPIRATORY: Clear to auscultation bilaterally. Effort and breath sounds normal, no problems with respiration noted.  BREASTS: Symmetric in size. No masses, skin changes, nipple drainage, or lymphadenopathy.  ABDOMEN: Soft, normal bowel sounds, no distention noted.  No tenderness, rebound or guarding.   PELVIC:  External Genitalia: Normal  Vagina: Normal  Cervix: Normal, IUD strings present, Pap collected  Uterus: Normal  Adnexa: Normal   MUSCULOSKELETAL: Normal range of motion. No tenderness.  No cyanosis, clubbing, or edema.  2+ distal pulses.  LYMPHATIC: No Axillary, Supraclavicular, or Inguinal Adenopathy.  Assessment:   Annual gynecologic examination 29 y.o.   Contraception: IUD, Paragard  Normal BMI   Problem List Items Addressed This Visit    None    Visit Diagnoses    Well woman exam    -  Primary   Relevant Orders   Cytology - PAP   CBC   Comprehensive metabolic panel   Screening for cervical cancer       Relevant Orders   Cytology - PAP   IUD (intrauterine device) in place       Relevant Orders   Cytology - PAP      Plan:   Pap: Pap Co Test  Labs: See oders   Flu vaccine given, see chart  Routine preventative health maintenance measures emphasized: Exercise/Diet/Weight control, Tobacco Warnings, Alcohol/Substance use risks and Stress Management; see AVS  Reviewed red flag symptoms and when to call  Return to [place], CNM [facility], [facility] 09/24/19 9:33 AM

## 2019-09-24 NOTE — Patient Instructions (Signed)
Intrauterine Device Information An intrauterine device (IUD) is a medical device that is inserted in the uterus to prevent pregnancy. It is a small, T-shaped device that has one or two nylon strings hanging down from it. The strings hang out of the lower part of the uterus (cervix) to allow for future IUD removal. There are two types of IUDs available:  Hormone IUD. This type of IUD is made of plastic and contains the hormone progestin (synthetic progesterone). A hormone IUD may last 3-5 years.  Copper IUD. This type of IUD has copper wire wrapped around it. A copper IUD may last up to 10 years. How is an IUD inserted? An IUD is inserted through the vagina and placed into the uterus with a minor medical procedure. The exact procedure for IUD insertion may vary among health care providers and hospitals. How does an IUD work? Synthetic progesterone in a hormonal IUD prevents pregnancy by:  Thickening cervical mucus to prevent sperm from entering the uterus.  Thinning the uterine lining to prevent a fertilized egg from being implanted there. Copper in a copper IUD prevents pregnancy by making the uterus and fallopian tubes produce a fluid that kills sperm. What are the advantages of an IUD? Advantages of either type of IUD  It is highly effective in preventing pregnancy.  It is reversible. You can become pregnant shortly after the IUD is removed.  It is low-maintenance and can stay in place for a long time.  There are no estrogen-related side effects.  It can be used when breastfeeding.  It is not associated with weight gain.  It can be inserted right after childbirth, an abortion, or a miscarriage. Advantages of a hormone IUD  If it is inserted within 7 days of your period starting, it works right after it is inserted. If the hormone IUD is inserted at any other time in your cycle, you will need to use a backup method of birth control for 7 days after insertion.  It can make  menstrual periods lighter.  It can reduce menstrual cramping.  It can be used for 3-5 years. Advantages of a copper IUD  It works right after it is inserted.  It can be used as a form of emergency birth control if it is inserted within 5 days after having unprotected sex.  It does not interfere with your body's natural hormones.  It can be used for 10 years. What are the disadvantages of an IUD?  An IUD may cause irregular menstrual bleeding for a period of time after insertion.  You may have pain during insertion and have cramping and vaginal bleeding after insertion.  An IUD may cut the uterus (uterine perforation) when it is inserted. This is rare.  An IUD may cause pelvic inflammatory disease (PID), which is an infection in the uterus and fallopian tubes. This is rare, and it usually happens during the first 20 days after the IUD is inserted.  A copper IUD can make your menstrual flow heavier and more painful. How is an IUD removed?  You will lie on your back with your knees bent and your feet in footrests (stirrups).  A device will be inserted into your vagina to spread apart the vaginal walls (speculum). This will allow your health care provider to see the strings attached to the IUD.  Your health care provider will use a small instrument (forceps) to grasp the IUD strings and pull firmly until the IUD is removed. You may have some discomfort   when the IUD is removed. Your health care provider may recommend taking over-the-counter pain relievers, such as ibuprofen, before the procedure. You may also have minor spotting for a few days after the procedure. The exact procedure for IUD removal may vary among health care providers and hospitals. Is the IUD right for me? Your health care provider will make sure you are a good candidate for an IUD and will discuss the advantages, disadvantages, and possible side effects with you. Summary  An intrauterine device (IUD) is a medical  device that is inserted in the uterus to prevent pregnancy. It is a small, T-shaped device that has one or two nylon strings hanging down from it.  A hormone IUD contains the hormone progestin (synthetic progesterone). A copper IUD has copper wire wrapped around it.  Synthetic progesterone in a hormone IUD prevents pregnancy by thickening cervical mucus and thinning the walls of the uterus. Copper in a copper IUD prevents pregnancy by making the uterus and fallopian tubes produce a fluid that kills sperm.  A hormone IUD can be left in place for 3-5 years. A copper IUD can be left in place for up to 10 years.  An IUD is inserted and removed by a health care provider. You may feel some pain during insertion and removal. Your health care provider may recommend taking over-the-counter pain medicine, such as ibuprofen, before an IUD procedure. This information is not intended to replace advice given to you by your health care provider. Make sure you discuss any questions you have with your health care provider. Document Revised: 06/10/2017 Document Reviewed: 07/27/2016 Elsevier Patient Education  2020 [employer] 68-31 Years Old, Female Preventive care refers to visits with your health care provider and lifestyle choices that can promote health and wellness. This includes:  A yearly physical exam. This may also be called an annual well check.  Regular dental visits and eye exams.  Immunizations.  Screening for certain conditions.  Healthy lifestyle choices, such as eating a healthy diet, getting regular exercise, not using drugs or products that contain nicotine and tobacco, and limiting alcohol use. What can I expect for my preventive care visit? Physical exam Your health care provider will check your:  Height and weight. This may be used to calculate body mass index (BMI), which tells if you are at a healthy weight.  Heart rate and blood pressure.  Skin for  abnormal spots. Counseling Your health care provider may ask you questions about your:  Alcohol, tobacco, and drug use.  Emotional well-being.  Home and relationship well-being.  Sexual activity.  Eating habits.  Work and work Statistician.  Method of birth control.  Menstrual cycle.  Pregnancy history. What immunizations do I need?  Influenza (flu) vaccine  This is recommended every year. Tetanus, diphtheria, and pertussis (Tdap) vaccine  You may need a Td booster every 10 years. Varicella (chickenpox) vaccine  You may need this if you have not been vaccinated. Human papillomavirus (HPV) vaccine  If recommended by your health care provider, you may need three doses over 6 months. Measles, mumps, and rubella (MMR) vaccine  You may need at least one dose of MMR. You may also need a second dose. Meningococcal conjugate (MenACWY) vaccine  One dose is recommended if you are age 17-21 years and a first-year college student living in a residence hall, or if you have one of several medical conditions. You may also need additional booster doses. Pneumococcal conjugate (PCV13) vaccine  You may need this if you have certain conditions and were not previously vaccinated. Pneumococcal polysaccharide (PPSV23) vaccine  You may need one or two doses if you smoke cigarettes or if you have certain conditions. Hepatitis A vaccine  You may need this if you have certain conditions or if you travel or work in places where you may be exposed to hepatitis A. Hepatitis B vaccine  You may need this if you have certain conditions or if you travel or work in places where you may be exposed to hepatitis B. Haemophilus influenzae type b (Hib) vaccine  You may need this if you have certain conditions. You may receive vaccines as individual doses or as more than one vaccine together in one shot (combination vaccines). Talk with your health care provider about the risks and benefits of  combination vaccines. What tests do I need?  Blood tests  Lipid and cholesterol levels. These may be checked every 5 years starting at age 72.  Hepatitis C test.  Hepatitis B test. Screening  Diabetes screening. This is done by checking your blood sugar (glucose) after you have not eaten for a while (fasting).  Sexually transmitted disease (STD) testing.  BRCA-related cancer screening. This may be done if you have a family history of breast, ovarian, tubal, or peritoneal cancers.  Pelvic exam and Pap test. This may be done every 3 years starting at age 81. Starting at age 24, this may be done every 5 years if you have a Pap test in combination with an HPV test. Talk with your health care provider about your test results, treatment options, and if necessary, the need for more tests. Follow these instructions at home: Eating and drinking   Eat a diet that includes fresh fruits and vegetables, whole grains, lean protein, and low-fat dairy.  Take vitamin and mineral supplements as recommended by your health care provider.  Do not drink alcohol if: ? Your health care provider tells you not to drink. ? You are pregnant, may be pregnant, or are planning to become pregnant.  If you drink alcohol: ? Limit how much you have to 0-1 drink a day. ? Be aware of how much alcohol is in your drink. In the [place] one drink equals one 12 oz bottle of beer (355 mL), one 5 oz glass of wine (148 mL), or one 1 oz glass of hard liquor (44 mL). Lifestyle  Take daily care of your teeth and gums.  Stay active. Exercise for at least 30 minutes on 5 or more days each week.  Do not use any products that contain nicotine or tobacco, such as cigarettes, e-cigarettes, and chewing tobacco. If you need help quitting, ask your health care provider.  If you are sexually active, practice safe sex. Use a condom or other form of birth control (contraception) in order to prevent pregnancy and STIs (sexually  transmitted infections). If you plan to become pregnant, see your health care provider for a preconception visit. What's next?  Visit your health care provider once a year for a well check visit.  Ask your health care provider how often you should have your eyes and teeth checked.  Stay up to date on all vaccines. This information is not intended to replace advice given to you by your health care provider. Make sure you discuss any questions you have with your health care provider. Document Revised: 03/09/2018 Document Reviewed: 03/09/2018 Elsevier Patient Education  2020 [employer].

## 2019-09-25 LAB — COMPREHENSIVE METABOLIC PANEL
ALT: 12 IU/L (ref 0–32)
AST: 19 IU/L (ref 0–40)
Albumin/Globulin Ratio: 1.7 (ref 1.2–2.2)
Albumin: 4.8 g/dL (ref 3.9–5.0)
Alkaline Phosphatase: 70 IU/L (ref 39–117)
BUN/Creatinine Ratio: 24 — ABNORMAL HIGH (ref 9–23)
BUN: 16 mg/dL (ref 6–20)
Bilirubin Total: 0.3 mg/dL (ref 0.0–1.2)
CO2: 22 mmol/L (ref 20–29)
Calcium: 9.7 mg/dL (ref 8.7–10.2)
Chloride: 103 mmol/L (ref 96–106)
Creatinine, Ser: 0.67 mg/dL (ref 0.57–1.00)
GFR calc Af Amer: 136 mL/min/{1.73_m2} (ref >59)
GFR calc non Af Amer: 118 mL/min/{1.73_m2} (ref >59)
Globulin, Total: 2.8 g/dL (ref 1.5–4.5)
Glucose: 85 mg/dL (ref 65–99)
Potassium: 4.5 mmol/L (ref 3.5–5.2)
Sodium: 142 mmol/L (ref 134–144)
Total Protein: 7.6 g/dL (ref 6.0–8.5)

## 2019-09-25 LAB — CBC
Hematocrit: 42.1 % (ref 34.0–46.6)
Hemoglobin: 13.8 g/dL (ref 11.1–15.9)
MCH: 29.2 pg (ref 26.6–33.0)
MCHC: 32.8 g/dL (ref 31.5–35.7)
MCV: 89 fL (ref 79–97)
Platelets: 268 10*3/uL (ref 150–450)
RBC: 4.72 x10E6/uL (ref 3.77–5.28)
RDW: 12.5 % (ref 11.7–15.4)
WBC: 5.8 10*3/uL (ref 3.4–10.8)

## 2019-09-26 LAB — CYTOLOGY - PAP
Comment: NEGATIVE
Diagnosis: NEGATIVE
High risk HPV: NEGATIVE

## 2019-10-15 ENCOUNTER — Other Ambulatory Visit
Admission: RE | Admit: 2019-10-15 | Discharge: 2019-10-15 | Disposition: A | Discharge status: Home / Self Care | Payer: Medicaid Other | Source: Ambulatory Visit | Attending: Student | Admitting: Student

## 2019-10-15 DIAGNOSIS — R0602 Shortness of breath: Secondary | ICD-10-CM | POA: Insufficient documentation

## 2019-10-15 LAB — FIBRIN DERIVATIVES D-DIMER (ARMC ONLY): Fibrin derivatives D-dimer (ARMC): 249.09 ng/mL (FEU) (ref 0.00–499.00)

## 2020-09-25 ENCOUNTER — Encounter: Payer: Medicaid Other | Admitting: Certified Nurse Midwife

## 2020-10-04 ENCOUNTER — Other Ambulatory Visit: Payer: Self-pay

## 2020-10-04 ENCOUNTER — Emergency Department
Admission: EM | Admit: 2020-10-04 | Discharge: 2020-10-05 | Disposition: A | Discharge status: Home / Self Care | Payer: Medicaid Other | Attending: Emergency Medicine | Admitting: Emergency Medicine

## 2020-10-04 ENCOUNTER — Emergency Department: Payer: Medicaid Other

## 2020-10-04 DIAGNOSIS — R062 Wheezing: Secondary | ICD-10-CM

## 2020-10-04 DIAGNOSIS — J4521 Mild intermittent asthma with (acute) exacerbation: Secondary | ICD-10-CM | POA: Insufficient documentation

## 2020-10-04 LAB — CBC
HCT: 39.6 % (ref 36.0–46.0)
Hemoglobin: 13.1 g/dL (ref 12.0–15.0)
MCH: 29.4 pg (ref 26.0–34.0)
MCHC: 33.1 g/dL (ref 30.0–36.0)
MCV: 89 fL (ref 80.0–100.0)
Platelets: 315 10*3/uL (ref 150–400)
RBC: 4.45 MIL/uL (ref 3.87–5.11)
RDW: 12.5 % (ref 11.5–15.5)
WBC: 10 10*3/uL (ref 4.0–10.5)
nRBC: 0 % (ref 0.0–0.2)

## 2020-10-04 LAB — BASIC METABOLIC PANEL
Anion gap: 8 (ref 5–15)
BUN: 15 mg/dL (ref 6–20)
CO2: 26 mmol/L (ref 22–32)
Calcium: 9.3 mg/dL (ref 8.9–10.3)
Chloride: 108 mmol/L (ref 98–111)
Creatinine, Ser: 0.83 mg/dL (ref 0.44–1.00)
GFR, Estimated: >60 mL/min (ref >60)
Glucose, Bld: 94 mg/dL (ref 70–99)
Potassium: 3.7 mmol/L (ref 3.5–5.1)
Sodium: 142 mmol/L (ref 135–145)

## 2020-10-04 LAB — TROPONIN I (HIGH SENSITIVITY): Troponin I (High Sensitivity): <2 ng/L (ref <18)

## 2020-10-04 MED ORDER — ALBUTEROL SULFATE HFA 108 (90 BASE) MCG/ACT IN AERS
4.0000 | INHALATION_SPRAY | Freq: Once | RESPIRATORY_TRACT | Status: AC
  Administered 2020-10-05: 4 via RESPIRATORY_TRACT
  Filled 2020-10-04: qty 6.7

## 2020-10-04 MED ORDER — ALBUTEROL SULFATE HFA 108 (90 BASE) MCG/ACT IN AERS: 2.0000 | INHALATION_SPRAY | Freq: Once | RESPIRATORY_TRACT | Status: DC

## 2020-10-04 MED ORDER — ALBUTEROL SULFATE HFA 108 (90 BASE) MCG/ACT IN AERS: 2.0000 | INHALATION_SPRAY | Freq: Four times a day (QID) | RESPIRATORY_TRACT | 0 refills | Status: DC | PRN

## 2020-10-04 MED ORDER — PREDNISONE 20 MG PO TABS
60.0000 mg | ORAL_TABLET | Freq: Once | ORAL | Status: AC
  Administered 2020-10-05: 60 mg via ORAL
  Filled 2020-10-04: qty 3

## 2020-10-04 MED ORDER — PREDNISONE 20 MG PO TABS: 40.0000 mg | ORAL_TABLET | Freq: Every day | ORAL | 0 refills | Status: AC

## 2020-10-04 NOTE — ED Provider Notes (Signed)
[facility] Emergency Department Provider Note  ____________________________________________   Event Date/Time   First MD Initiated Contact with Patient 10/04/20 2244     (approximate)  I have reviewed the triage vital signs and the nursing notes.   HISTORY  Chief Complaint Shortness of Breath and Cough    HPI Michelle Arellano is a 30 y.o. female here with cough.  The patient states that she has a history of seasonal allergies.  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  She states that over the last 3 weeks, return she is at work, she has had progressively worsening rhinorrhea, nasal congestion, and cough.  She said wheezing.  This seems to improve when she is not at work.  She states that she has been taking over-the-counter antihistamines with no significant relief.  Her symptoms seems to be progressively worsening.  Earlier today while at work, she had acute worsening of her shortness of breath with wheezing.  No sputum production.  No chest pain.  No other complaints.  No leg swelling.  Denies known history of asthma.        Past Medical History:  Diagnosis Date  . Bell's palsy     There are no problems to display for this patient.   Past Surgical History:  Procedure Laterality Date  . cyst removed     rt arm  . WISDOM TOOTH EXTRACTION      Prior to Admission medications   Medication Sig Start Date End Date Taking? Authorizing Provider  albuterol (VENTOLIN HFA) 108 (90 Base) MCG/ACT inhaler Inhale 2 puffs into the lungs every 6 (six) hours as needed for wheezing or shortness of breath. 10/04/20  Yes Shaune Pollack, MD  predniSONE (DELTASONE) 20 MG tablet Take 2 tablets (40 mg total) by mouth daily for 5 days. 10/04/20 10/09/20 Yes Shaune Pollack, MD    Allergies Patient has no known allergies.  Family History  Problem Relation Age of Onset  . Diabetes Mother   . Heart disease Maternal Grandmother   . Heart failure Maternal  Grandfather   . Lung cancer Maternal Grandfather   . Asthma Neg Hx   . Ovarian cancer Neg Hx   . Colon cancer Neg Hx   . Breast cancer Neg Hx     Social History Social History   Tobacco Use  . Smoking status: Never Smoker  . Smokeless tobacco: Never Used  Vaping Use  . Vaping Use: Never used  Substance Use Topics  . Alcohol use: Yes    Comment: wine occasionally   . Drug use: Yes    Frequency: 1.0 times per week    Types: Marijuana    Review of Systems  Review of Systems  Constitutional: Positive for fatigue. Negative for fever.  HENT: Negative for congestion and sore throat.   Eyes: Negative for visual disturbance.  Respiratory: Positive for cough and wheezing. Negative for shortness of breath.   Cardiovascular: Negative for chest pain.  Gastrointestinal: Negative for abdominal pain, diarrhea, nausea and vomiting.  Genitourinary: Negative for flank pain.  Musculoskeletal: Negative for back pain and neck pain.  Skin: Negative for rash and wound.  Neurological: Negative for weakness.  All other systems reviewed and are negative.    ____________________________________________  PHYSICAL EXAM:      VITAL SIGNS: ED Triage Vitals  Enc Vitals Group     BP 10/04/20 2054 97/86     Pulse Rate 10/04/20 2054 (!) 106     Resp 10/04/20  2054 (!) 25     Temp 10/04/20 2054 97.8 F (36.6 C)     Temp Source 10/04/20 2054 Oral     SpO2 10/04/20 2054 94 %     Weight 10/04/20 2052 114 lb (51.7 kg)     Height 10/04/20 2052 5\' 2"  (1.575 m)     Head Circumference --      Peak Flow --      Pain Score 10/04/20 2052 7     Pain Loc --      Pain Edu? --      Excl. in GC? --      Physical Exam Vitals and nursing note reviewed.  Constitutional:      General: She is not in acute distress.    Appearance: She is well-developed.  HENT:     Head: Normocephalic and atraumatic.  Eyes:     Conjunctiva/sclera: Conjunctivae normal.  Cardiovascular:     Rate and Rhythm: Normal rate  and regular rhythm.     Heart sounds: Normal heart sounds. No murmur heard. No friction rub.  Pulmonary:     Effort: Pulmonary effort is normal. No respiratory distress.     Breath sounds: Wheezing (scant, expiratory) present. No rales.  Abdominal:     General: There is no distension.     Palpations: Abdomen is soft.     Tenderness: There is no abdominal tenderness.  Musculoskeletal:     Cervical back: Neck supple.  Skin:    General: Skin is warm.     Capillary Refill: Capillary refill takes less than 2 seconds.  Neurological:     Mental Status: She is alert and oriented to person, place, and time.     Motor: No abnormal muscle tone.       ____________________________________________   LABS (all labs ordered are listed, but only abnormal results are displayed)  Labs Reviewed  BASIC METABOLIC PANEL  CBC  TROPONIN I (HIGH SENSITIVITY)    ____________________________________________  EKG: Normal sinus rhythm, ventricular 95.  PR 124, QRS 68, QTc 434.  No acute ST elevations or depression.  No KG evidence of acute ischemia or infarct. ________________________________________  RADIOLOGY All imaging, including plain films, CT scans, and ultrasounds, independently reviewed by me, and interpretations confirmed via formal radiology reads.  ED MD interpretation:   CXR: Clear  Official radiology report(s): DG Chest 2 View  Result Date: 10/04/2020 CLINICAL DATA:  Shortness of breath.  Cough. EXAM: CHEST - 2 VIEW COMPARISON:  None. FINDINGS: The cardiomediastinal contours are normal. The lungs are clear. Pulmonary vasculature is normal. No consolidation, pleural effusion, or pneumothorax. No acute osseous abnormalities are seen. Bilateral nipple shadows noted. IMPRESSION: Negative radiographs of the chest. Electronically Signed   By: 10/06/2020 M.D.   On: 10/04/2020 21:29    ____________________________________________  PROCEDURES   Procedure(s) performed (including  Critical Care):  Procedures  ____________________________________________  INITIAL IMPRESSION / MDM / ASSESSMENT AND PLAN / ED COURSE  As part of my medical decision making, I reviewed the following data within the electronic MEDICAL RECORD NUMBER Nursing notes reviewed and incorporated, Old chart reviewed, Notes from prior ED visits, and [place] Controlled Substance Database       *BEKAH IGOE was evaluated in Emergency Department on 10/04/2020 for the symptoms described in the history of present illness. She was evaluated in the context of the global COVID-19 pandemic, which necessitated consideration that the patient might be at risk for infection with the SARS-CoV-2 virus that causes COVID-19.  Institutional protocols and algorithms that pertain to the evaluation of patients at risk for COVID-19 are in a state of rapid change based on information released by regulatory bodies including the CDC and federal and state organizations. These policies and algorithms were followed during the patient's care in the ED.  Some ED evaluations and interventions may be delayed as a result of limited staffing during the pandemic.*     Medical Decision Making: Very well-appearing 30 year old female here with cough, wheezing.  Suspect she has likely component of underlying reactive airway disease that is being exacerbated by her work conditions.  She does have some mild expiratory wheezes but normal work of breathing.  Speaking full sentences.  No hypoxia.  Chest x-ray reviewed, shows no evidence of pneumonia.  EKG is nonischemic.  Troponin negative despite fairly constant chest pain, do not suspect AC'S.  Do not suspect PE clinically.  Will place on prednisone, albuterol, and discharge with outpatient follow-up.  Return precautions given.   ____________________________________________  FINAL CLINICAL IMPRESSION(S) / ED DIAGNOSES  Final diagnoses:  Wheezing  Mild intermittent reactive airway disease with acute  exacerbation     MEDICATIONS GIVEN DURING THIS VISIT:  Medications  predniSONE (DELTASONE) tablet 60 mg (has no administration in time range)  albuterol (VENTOLIN HFA) 108 (90 Base) MCG/ACT inhaler 4 puff (has no administration in time range)     ED Discharge Orders         Ordered    predniSONE (DELTASONE) 20 MG tablet  Daily        10/04/20 2334    albuterol (VENTOLIN HFA) 108 (90 Base) MCG/ACT inhaler  Every 6 hours PRN        10/04/20 2334           Note:  This document was prepared using Dragon voice recognition software and may include unintentional dictation errors.   Shaune Pollack, MD 10/04/20 2337

## 2020-10-04 NOTE — Discharge Instructions (Addendum)
For your wheezing:  Start the prednisone as prescribed Use the ALBUTEROL inhaler every 2-4 hours for the next 24 hours, then as needed Continue your zyrtec/allergy medication  I would HIGHLY recommend starting to use the inhaler, 2 puffs, before and during work at least once, to help with your breathing  Try to open windows and keep the area free of allergens

## 2020-10-04 NOTE — ED Triage Notes (Signed)
Pt comes pov with sob, mucus, cough since yesterday afternoon. Pt states having trouble breathing from all the mucus in her chest. Audible congestion in chest noted.

## 2020-10-09 ENCOUNTER — Telehealth: Payer: Self-pay

## 2020-10-09 NOTE — Telephone Encounter (Signed)
After receiving an ER referral, called pt. She said she has already contacted [place] and will come in on Monday to get the paperwork.

## 2020-10-15 ENCOUNTER — Other Ambulatory Visit: Payer: Self-pay

## 2020-10-15 ENCOUNTER — Ambulatory Visit: Payer: Medicaid Other | Admitting: Gerontology

## 2020-10-15 ENCOUNTER — Encounter: Payer: Self-pay | Admitting: Gerontology

## 2020-10-15 VITALS — BP 110/74 | HR 90 | Temp 99.1°F | Resp 16 | Wt 136.6 lb

## 2020-10-15 DIAGNOSIS — R059 Cough, unspecified: Secondary | ICD-10-CM

## 2020-10-15 DIAGNOSIS — T7840XA Allergy, unspecified, initial encounter: Secondary | ICD-10-CM | POA: Insufficient documentation

## 2020-10-15 DIAGNOSIS — Z7689 Persons encountering health services in other specified circumstances: Secondary | ICD-10-CM

## 2020-10-15 MED ORDER — BENZONATATE 100 MG PO CAPS
100.0000 mg | ORAL_CAPSULE | Freq: Three times a day (TID) | ORAL | 0 refills | Status: DC | PRN
  Filled 2020-10-15: qty 20, 7d supply, fill #0

## 2020-10-15 MED ORDER — ALBUTEROL SULFATE HFA 108 (90 BASE) MCG/ACT IN AERS
2.0000 | INHALATION_SPRAY | Freq: Four times a day (QID) | RESPIRATORY_TRACT | 2 refills | Status: DC | PRN
  Filled 2020-10-15: qty 6.7, 30d supply, fill #0
  Filled 2021-01-09: qty 6.7, 25d supply, fill #1

## 2020-10-15 MED ORDER — CETIRIZINE HCL 10 MG PO TABS
10.0000 mg | ORAL_TABLET | Freq: Every day | ORAL | 1 refills | Status: DC
  Filled 2020-10-15: qty 30, 30d supply, fill #0

## 2020-10-15 NOTE — Progress Notes (Signed)
 New Patient Office Visit  Subjective:  Patient ID: Michelle Arellano, female    DOB: 08/14/1990  Age: 30 y.o. MRN: [id number]  CC: No chief complaint on file.   HPI Michelle Arellano presents to establish care. She was seen at the ED on 10/04/20 for c/o shortness of breath and cough. She was treated with prednisone and currently she states that she continues to experience productive cough with clear phlegm. She also states that cough wakes her up at night. She states that her breathing is stable, denies shortness of breath, wheezing and chest tightness. She also reports having a history of environmental allergy and has taking many over the counter medications which minimally relieves her symptoms. Overall, she states that she's doing well and offers no further concerns.  Past Medical History:  Diagnosis Date  . Bell's palsy     Past Surgical History:  Procedure Laterality Date  . cyst removed     rt arm  . WISDOM TOOTH EXTRACTION      Family History  Problem Relation Age of Onset  . Diabetes Mother   . Heart disease Maternal Grandmother   . Heart failure Maternal Grandfather   . Lung cancer Maternal Grandfather   . Asthma Neg Hx   . Ovarian cancer Neg Hx   . Colon cancer Neg Hx   . Breast cancer Neg Hx     Social History   Socioeconomic History  . Marital status: Single    Spouse name: Not on file  . Number of children: Not on file  . Years of education: Not on file  . Highest education level: Not on file  Occupational History  . Not on file  Tobacco Use  . Smoking status: Never Smoker  . Smokeless tobacco: Never Used  Vaping Use  . Vaping Use: Never used  Substance and Sexual Activity  . Alcohol use: Yes    Comment: wine occasionally   . Drug use: Yes    Frequency: 1.0 times per week    Types: Marijuana  . Sexual activity: Yes    Partners: Male    Birth control/protection: Condom  Other Topics Concern  . Not on file  Social History Narrative  . Not on file    Social Determinants of Health   Financial Resource Strain: Not on file  Food Insecurity: Not on file  Transportation Needs: Not on file  Physical Activity: Not on file  Stress: Not on file  Social Connections: Not on file  Intimate Partner Violence: Not on file    ROS Review of Systems  Constitutional: Negative.   HENT: Negative.   Eyes: Negative.   Respiratory: Positive for cough (small amount of clear phelgm).   Cardiovascular: Negative.   Gastrointestinal: Negative.   Endocrine: Negative.   Genitourinary: Negative.   Musculoskeletal: Negative.   Skin: Negative.   Allergic/Immunologic: Positive for environmental allergies (cat).  Neurological: Negative.   Hematological: Negative.   Psychiatric/Behavioral: Negative.     Objective:   Today's Vitals: BP 110/74 (BP Location: Left Arm, Patient Position: Sitting, Cuff Size: Normal)   Pulse 90   Temp 99.1 F (37.3 C)   Resp 16   Wt 136 lb 9.6 oz (62 kg)   SpO2 98%   BMI 24.98 kg/m   Physical Exam Constitutional:      Appearance: Normal appearance.  HENT:     Head: Normocephalic and atraumatic.     Nose: Nose normal.     Mouth/Throat:       Mouth: Mucous membranes are moist.  Eyes:     Extraocular Movements: Extraocular movements intact.     Conjunctiva/sclera: Conjunctivae normal.     Pupils: Pupils are equal, round, and reactive to light.  Cardiovascular:     Rate and Rhythm: Normal rate and regular rhythm.     Pulses: Normal pulses.     Heart sounds: Normal heart sounds.  Pulmonary:     Effort: Pulmonary effort is normal.     Breath sounds: Normal breath sounds.  Abdominal:     General: Abdomen is flat. Bowel sounds are normal.     Palpations: Abdomen is soft.  Musculoskeletal:        General: Normal range of motion.     Cervical back: Normal range of motion.  Skin:    General: Skin is warm.  Neurological:     General: No focal deficit present.     Mental Status: She is alert and oriented to  person, place, and time. Mental status is at baseline.  Psychiatric:        Mood and Affect: Mood normal.        Behavior: Behavior normal.        Thought Content: Thought content normal.        Judgment: Judgment normal.     Assessment & Plan:    1. Encounter to establish care - Routine labs will be checked - Comp Met (CMET); Future - Urinalysis; Future - Urinalysis - Comp Met (CMET)  2. Cough - She will continue on Tessalon perles, educated on medication side effects and to notify the clinic. - benzonatate (TESSALON PERLES) 100 MG capsule; Take 1 capsule (100 mg total) by mouth 3 (three) times daily as needed for cough.  Dispense: 20 capsule; Refill: 0  3. Allergy, initial encounter - She will continue on Zyrtec, educated on medication side effects and to notify clinic. - cetirizine (ZYRTEC ALLERGY) 10 MG tablet; Take 1 tablet (10 mg total) by mouth daily.  Dispense: 30 tablet; Refill: 1 - albuterol (VENTOLIN HFA) 108 (90 Base) MCG/ACT inhaler; Inhale 2 puffs into the lungs every 6 (six) hours as needed for wheezing or shortness of breath.  Dispense: 8 g; Refill: 2    Follow-up: Return in about 3 months (around 01/14/2021), or if symptoms worsen or fail to improve.   Chioma E Iloabachie, NP 

## 2020-10-16 ENCOUNTER — Other Ambulatory Visit: Payer: Self-pay

## 2020-10-16 LAB — URINALYSIS
Bilirubin, UA: NEGATIVE
Glucose, UA: NEGATIVE
Ketones, UA: NEGATIVE
Leukocytes,UA: NEGATIVE
Nitrite, UA: NEGATIVE
Protein,UA: NEGATIVE
RBC, UA: NEGATIVE
Specific Gravity, UA: 1.02 (ref 1.005–1.030)
Urobilinogen, Ur: 0.2 mg/dL (ref 0.2–1.0)
pH, UA: 7 (ref 5.0–7.5)

## 2020-10-16 LAB — COMPREHENSIVE METABOLIC PANEL
ALT: 15 IU/L (ref 0–32)
AST: 18 IU/L (ref 0–40)
Albumin/Globulin Ratio: 2 (ref 1.2–2.2)
Albumin: 4.8 g/dL (ref 3.8–4.8)
Alkaline Phosphatase: 68 IU/L (ref 44–121)
BUN/Creatinine Ratio: 21 (ref 9–23)
BUN: 15 mg/dL (ref 6–20)
Bilirubin Total: 0.4 mg/dL (ref 0.0–1.2)
CO2: 21 mmol/L (ref 20–29)
Calcium: 9.7 mg/dL (ref 8.7–10.2)
Chloride: 101 mmol/L (ref 96–106)
Creatinine, Ser: 0.72 mg/dL (ref 0.57–1.00)
Globulin, Total: 2.4 g/dL (ref 1.5–4.5)
Glucose: 82 mg/dL (ref 65–99)
Potassium: 4.2 mmol/L (ref 3.5–5.2)
Sodium: 138 mmol/L (ref 134–144)
Total Protein: 7.2 g/dL (ref 6.0–8.5)
eGFR: 115 mL/min/{1.73_m2} (ref >59)

## 2020-11-13 ENCOUNTER — Encounter: Payer: Self-pay | Admitting: Certified Nurse Midwife

## 2020-11-13 ENCOUNTER — Other Ambulatory Visit: Payer: Self-pay

## 2020-11-13 ENCOUNTER — Ambulatory Visit (INDEPENDENT_AMBULATORY_CARE_PROVIDER_SITE_OTHER): Payer: Medicaid Other | Admitting: Certified Nurse Midwife

## 2020-11-13 VITALS — BP 110/77 | HR 79 | Resp 16 | Ht 61.0 in | Wt 134.5 lb

## 2020-11-13 DIAGNOSIS — Z30431 Encounter for routine checking of intrauterine contraceptive device: Secondary | ICD-10-CM

## 2020-11-13 DIAGNOSIS — Z01419 Encounter for gynecological examination (general) (routine) without abnormal findings: Secondary | ICD-10-CM | POA: Diagnosis not present

## 2020-11-13 DIAGNOSIS — Z1159 Encounter for screening for other viral diseases: Secondary | ICD-10-CM

## 2020-11-13 DIAGNOSIS — Z975 Presence of (intrauterine) contraceptive device: Secondary | ICD-10-CM

## 2020-11-13 NOTE — Progress Notes (Signed)
ANNUAL PREVENTATIVE CARE GYN  ENCOUNTER NOTE  Subjective:       Michelle Arellano is a 30 y.o. G6P1011 female here for a routine annual gynecologic exam. No questions or concerns.   Denies difficulty breathing or respiratory distress, chest pain, abdominal pain, excessive vaginal bleeding, dysuria, and leg pain or swelling.    Gynecologic History  Patient's last menstrual period was 10/30/2020 (exact date).  Contraception: IUD, Paragard  Last Pap: 09/2019. Results were: Neg/Neg  Obstetric History  OB History  Gravida Para Term Preterm AB Living  2 1 1   1 1   SAB IAB Ectopic Multiple Live Births    1   0 1    # Outcome Date GA Lbr Len/2nd Weight Sex Delivery Anes PTL Lv  2 Term 04/15/17 [redacted]w[redacted]d 02:43 / 06:54 6 lb 10 oz (3.005 kg) F Vag-Spont EPI  LIV  1 IAB 2006            Past Medical History:  Diagnosis Date  . Bell's palsy     Past Surgical History:  Procedure Laterality Date  . cyst removed     rt arm  . WISDOM TOOTH EXTRACTION      Current Outpatient Medications on File Prior to Visit  Medication Sig Dispense Refill  . albuterol (VENTOLIN HFA) 108 (90 Base) MCG/ACT inhaler Inhale 2 puffs into the lungs every 6 (six) hours as needed for wheezing or shortness of breath. 8 g 2  . cetirizine (ZYRTEC ALLERGY) 10 MG tablet Take 1 tablet (10 mg total) by mouth daily. 30 tablet 1   No current facility-administered medications on file prior to visit.    No Known Allergies  Social History   Socioeconomic History  . Marital status: Single    Spouse name: Not on file  . Number of children: Not on file  . Years of education: Not on file  . Highest education level: Not on file  Occupational History  . Not on file  Tobacco Use  . Smoking status: Never Smoker  . Smokeless tobacco: Never Used  Vaping Use  . Vaping Use: Never used  Substance and Sexual Activity  . Alcohol use: Yes    Comment: wine occasionally   . Drug use: Yes    Frequency: 1.0 times per week     Types: Marijuana  . Sexual activity: Yes    Partners: Male    Birth control/protection: Condom  Other Topics Concern  . Not on file  Social History Narrative  . Not on file   Social Determinants of Health   Financial Resource Strain: Not on file  Food Insecurity: Not on file  Transportation Needs: Not on file  Physical Activity: Not on file  Stress: Not on file  Social Connections: Not on file  Intimate Partner Violence: Not on file    Family History  Problem Relation Age of Onset  . Diabetes Mother   . Heart disease Maternal Grandmother   . Heart failure Maternal Grandfather   . Lung cancer Maternal Grandfather   . Asthma Neg Hx   . Ovarian cancer Neg Hx   . Colon cancer Neg Hx   . Breast cancer Neg Hx     The following portions of the patient's history were reviewed and updated as appropriate: allergies, current medications, past family history, past medical history, past social history, past surgical history and problem list.  Review of Systems  ROS negative except as noted above. Information obtained from patient.  Objective:   BP 110/77   Pulse 79   Resp 16   Ht 5\' 1"  (1.549 m)   Wt 134 lb 8 oz (61 kg)   LMP 10/30/2020 (Exact Date)   BMI 25.41 kg/m    CONSTITUTIONAL: Well-developed, well-nourished female in no acute distress.   PSYCHIATRIC: Normal mood and affect. Normal behavior. Normal judgment and thought content.  NEUROLGIC: Alert and oriented to person, place, and time. Normal muscle tone coordination. No cranial nerve deficit noted.  HENT:  Normocephalic, atraumatic.  EYES: Conjunctivae and EOM are normal.   NECK: Normal range of motion, supple, no masses.  Normal thyroid.   SKIN: Skin is warm and dry. No rash noted. Not diaphoretic. No erythema. No pallor. Professional tattoos present.   CARDIOVASCULAR: Normal heart rate noted, regular rhythm, no murmur.  RESPIRATORY: Clear to auscultation bilaterally. Effort and breath sounds normal, no  problems with respiration noted.  BREASTS: Symmetric in size. No masses, skin changes, nipple drainage, or lymphadenopathy.  ABDOMEN: Soft, normal bowel sounds, no distention noted.  No tenderness, rebound or guarding.   PELVIC:  External Genitalia: Normal  Vagina: Normal  Cervix: Normal, IUD string present  Uterus: Normal  Adnexa: Normal   MUSCULOSKELETAL: Normal range of motion. No tenderness.  No cyanosis, clubbing, or edema.  2+ distal pulses.  LYMPHATIC: No Axillary, Supraclavicular, or Inguinal Adenopathy.  Assessment:   Annual gynecologic examination 30 y.o.   Contraception: IUD, Paragard   Normal BMI   Problem List Items Addressed This Visit   None   Visit Diagnoses    Well woman exam    -  Primary   IUD (intrauterine device) in place       IUD check up          Plan:   Pap: Not needed  Labs: Declined   Routine preventative health maintenance measures emphasized: Exercise/Diet/Weight control, Tobacco Warnings, Alcohol/Substance use risks and Stress Management; see AVS  Reviewed red flag symptoms and when to call  Return to Clinic - 1 Year for 38 or sooner if needed   [employer], CNM  [facility], [facility] 11/13/20 10:23 AM

## 2020-11-13 NOTE — Patient Instructions (Signed)
Preventive Care 21-32 Years Old, Female Preventive care refers to lifestyle choices and visits with your health care provider that can promote health and wellness. This includes:  A yearly physical exam. This is also called an annual wellness visit.  Regular dental and eye exams.  Immunizations.  Screening for certain conditions.  Healthy lifestyle choices, such as: ? Eating a healthy diet. ? Getting regular exercise. ? Not using drugs or products that contain nicotine and tobacco. ? Limiting alcohol use. What can I expect for my preventive care visit? Physical exam Your health care provider may check your:  Height and weight. These may be used to calculate your BMI (body mass index). BMI is a measurement that tells if you are at a healthy weight.  Heart rate and blood pressure.  Body temperature.  Skin for abnormal spots. Counseling Your health care provider may ask you questions about your:  Past medical problems.  Family's medical history.  Alcohol, tobacco, and drug use.  Emotional well-being.  Home life and relationship well-being.  Sexual activity.  Diet, exercise, and sleep habits.  Work and work environment.  Access to firearms.  Method of birth control.  Menstrual cycle.  Pregnancy history. What immunizations do I need? Vaccines are usually given at various ages, according to a schedule. Your health care provider will recommend vaccines for you based on your age, medical history, and lifestyle or other factors, such as travel or where you work.   What tests do I need? Blood tests  Lipid and cholesterol levels. These may be checked every 5 years starting at age 20.  Hepatitis C test.  Hepatitis B test. Screening  Diabetes screening. This is done by checking your blood sugar (glucose) after you have not eaten for a while (fasting).  STD (sexually transmitted disease) testing, if you are at risk.  BRCA-related cancer screening. This may be  done if you have a family history of breast, ovarian, tubal, or peritoneal cancers.  Pelvic exam and Pap test. This may be done every 3 years starting at age 21. Starting at age 30, this may be done every 5 years if you have a Pap test in combination with an HPV test. Talk with your health care provider about your test results, treatment options, and if necessary, the need for more tests.   Follow these instructions at home: Eating and drinking  Eat a healthy diet that includes fresh fruits and vegetables, whole grains, lean protein, and low-fat dairy products.  Take vitamin and mineral supplements as recommended by your health care provider.  Do not drink alcohol if: ? Your health care provider tells you not to drink. ? You are pregnant, may be pregnant, or are planning to become pregnant.  If you drink alcohol: ? Limit how much you have to 0-1 drink a day. ? Be aware of how much alcohol is in your drink. In the [place] one drink equals one 12 oz bottle of beer (355 mL), one 5 oz glass of wine (148 mL), or one 1 oz glass of hard liquor (44 mL).   Lifestyle  Take daily care of your teeth and gums. Brush your teeth every morning and night with fluoride toothpaste. Floss one time each day.  Stay active. Exercise for at least 30 minutes 5 or more days each week.  Do not use any products that contain nicotine or tobacco, such as cigarettes, e-cigarettes, and chewing tobacco. If you need help quitting, ask your health care provider.  Do not   use drugs.  If you are sexually active, practice safe sex. Use a condom or other form of protection to prevent STIs (sexually transmitted infections).  If you do not wish to become pregnant, use a form of birth control. If you plan to become pregnant, see your health care provider for a prepregnancy visit.  Find healthy ways to cope with stress, such as: ? Meditation, yoga, or listening to music. ? Journaling. ? Talking to a trusted  person. ? Spending time with friends and family. Safety  Always wear your seat belt while driving or riding in a vehicle.  Do not drive: ? If you have been drinking alcohol. Do not ride with someone who has been drinking. ? When you are tired or distracted. ? While texting.  Wear a helmet and other protective equipment during sports activities.  If you have firearms in your house, make sure you follow all gun safety procedures.  Seek help if you have been physically or sexually abused. What's next?  Go to your health care provider once a year for an annual wellness visit.  Ask your health care provider how often you should have your eyes and teeth checked.  Stay up to date on all vaccines. This information is not intended to replace advice given to you by your health care provider. Make sure you discuss any questions you have with your health care provider. Document Revised: 02/24/2020 Document Reviewed: 03/09/2018 Elsevier Patient Education  2021 [employer]  

## 2020-12-01 ENCOUNTER — Other Ambulatory Visit: Payer: Self-pay

## 2020-12-01 ENCOUNTER — Ambulatory Visit: Payer: Medicaid Other | Admitting: Pharmacy Technician

## 2020-12-01 DIAGNOSIS — Z79899 Other long term (current) drug therapy: Secondary | ICD-10-CM

## 2020-12-01 NOTE — Progress Notes (Signed)
Completed [facility] application and contract.  Patient agreed to all terms of the [facility] contract.    Patient still needs to provide 2 additional paystubs and bank statement before patient can be deemed eligible for [facility] services .    Provided patient with community resource material based on her particular needs.    Sherilyn Dacosta Care Manager [facility]

## 2021-01-01 ENCOUNTER — Other Ambulatory Visit: Payer: Self-pay

## 2021-01-01 ENCOUNTER — Telehealth: Payer: Self-pay | Admitting: Pharmacy Technician

## 2021-01-01 NOTE — Telephone Encounter (Signed)
Patient failed to provide 2022 proof of income.  No additional medication assistance will be provided by [facility] without the required proof of income documentation.  Patient notified by letter.  Sherilyn Dacosta Care Manager [facility]    [address] [place], [place]  [address]  January 01, 2021    [facility] [address] [place], [place]  [address]  Dear Arlyss Repress:  This is to inform you that you are no longer eligible to receive medication assistance at [facility].  The reason(s) are:    _____Your total gross monthly household income exceeds 250% of the Federal Poverty Level.   _____Tangible assets (savings, checking, stocks/bonds, pension, retirement, etc.) exceeds our limit  _____You are eligible to receive benefits from [facility], [facility] or HIV Medication              Assistance Program _____You are eligible to receive benefits from a Medicare Part "D" plan _____You have prescription insurance  _____You are not an [facility] resident __X__Failure to provide all requested documentation (proof of income for 2022, and/or Patient Intake Application, DOH Attestation, Contract, etc).  Still need to provide last 30 days of paystubs and bank statements.   Medication assistance will resume once all requested documentation has been returned to our clinic.  If you have questions, please contact our clinic at [phone].    Thank you,  [facility]

## 2021-01-02 ENCOUNTER — Other Ambulatory Visit: Payer: Self-pay

## 2021-01-07 ENCOUNTER — Other Ambulatory Visit: Payer: Self-pay

## 2021-01-07 ENCOUNTER — Telehealth: Payer: Self-pay | Admitting: Pharmacy Technician

## 2021-01-07 NOTE — Telephone Encounter (Signed)
Received updated proof of income.  Patient eligible to receive medication assistance at [facility] until time for re-certification in 2023, and as long as eligibility requirements continue to be met.  Eriberto Felch J. Kary Sugrue Care Manager [facility]  

## 2021-01-09 ENCOUNTER — Other Ambulatory Visit: Payer: Self-pay

## 2021-01-14 ENCOUNTER — Other Ambulatory Visit: Payer: Self-pay

## 2021-01-14 ENCOUNTER — Ambulatory Visit: Payer: Medicaid Other | Admitting: Gerontology

## 2021-01-14 ENCOUNTER — Encounter: Payer: Self-pay | Admitting: Gerontology

## 2021-01-14 VITALS — BP 102/64 | HR 83 | Temp 98.1°F | Resp 16 | Ht 61.0 in | Wt 134.3 lb

## 2021-01-14 DIAGNOSIS — T7840XD Allergy, unspecified, subsequent encounter: Secondary | ICD-10-CM

## 2021-01-14 DIAGNOSIS — T7840XA Allergy, unspecified, initial encounter: Secondary | ICD-10-CM

## 2021-01-14 DIAGNOSIS — R059 Cough, unspecified: Secondary | ICD-10-CM

## 2021-01-14 MED ORDER — CETIRIZINE HCL 10 MG PO TABS
10.0000 mg | ORAL_TABLET | Freq: Every day | ORAL | 3 refills | Status: DC
  Filled 2021-01-14: qty 30, 30d supply, fill #0
  Filled 2021-03-10: qty 30, 30d supply, fill #1
  Filled 2021-04-22: qty 30, 30d supply, fill #2
  Filled 2021-05-21: qty 30, 30d supply, fill #3

## 2021-01-14 MED ORDER — ALBUTEROL SULFATE HFA 108 (90 BASE) MCG/ACT IN AERS
2.0000 | INHALATION_SPRAY | Freq: Four times a day (QID) | RESPIRATORY_TRACT | 3 refills | Status: DC | PRN
  Filled 2021-02-19: qty 6.7, 25d supply, fill #0
  Filled 2021-04-02: qty 6.7, 25d supply, fill #1
  Filled 2021-05-08: qty 6.7, 25d supply, fill #2
  Filled 2021-06-15: qty 6.7, 25d supply, fill #3

## 2021-01-14 NOTE — Progress Notes (Signed)
Established Patient Office Visit  Subjective:  Patient ID: Michelle Arellano, female    DOB: [birth year]  Age: 30 y.o. MRN: [id number]  CC:  Chief Complaint  Patient presents with   Follow-up    HPI Michelle Arellano is a 30 year old female with a past medical history of Bell's palsy, allergy, presents for a routine follow up. She states that her cough and environmental allergy has resolved. She states she avoids cats which she stated triggers her allergies. She denies shortness of breath, chest tightness and wheezing. She states that she is compliant with her medications and continues to make healthy lifestyle changes. She was seen on 11/13/20 by her OBGYN provider, [place], Lara Mulch, CNM for her annual well woman exam. Overall, she states that she is doing well, in good mood and offers no further complaints.  Past Medical History:  Diagnosis Date   Allergy    Bell's palsy     Past Surgical History:  Procedure Laterality Date   cyst removed     rt arm   WISDOM TOOTH EXTRACTION      Family History  Problem Relation Age of Onset   Diabetes Mother    Healthy Father    Diabetes Brother    Autism Brother    Heart disease Maternal Grandmother    Heart failure Maternal Grandfather    Lung cancer Maternal Grandfather    Healthy Paternal Grandmother    Kidney disease Paternal Grandfather    Asthma Neg Hx    Ovarian cancer Neg Hx    Colon cancer Neg Hx    Breast cancer Neg Hx     Social History   Socioeconomic History   Marital status: Single    Spouse name: Not on file   Number of children: Not on file   Years of education: Not on file   Highest education level: Not on file  Occupational History   Not on file  Tobacco Use   Smoking status: Never   Smokeless tobacco: Never  Vaping Use   Vaping Use: Never used  Substance and Sexual Activity   Alcohol use: Yes    Comment: social   Drug use: Yes    Frequency: 2.0 times per week    Types: Marijuana    Comment: CBD    Sexual activity: Yes    Partners: Male    Birth control/protection: Condom  Other Topics Concern   Not on file  Social History Narrative   Not on file   Social Determinants of Health   Financial Resource Strain: Not on file  Food Insecurity: No Food Insecurity   Worried About Running Out of Food in the Last Year: Never true   Ran Out of Food in the Last Year: Never true  Transportation Needs: No Transportation Needs   Lack of Transportation (Medical): No   Lack of Transportation (Non-Medical): No  Physical Activity: Not on file  Stress: Not on file  Social Connections: Not on file  Intimate Partner Violence: Not on file    Outpatient Medications Prior to Visit  Medication Sig Dispense Refill   albuterol (VENTOLIN HFA) 108 (90 Base) MCG/ACT inhaler Inhale 2 puffs into the lungs every 6 (six) hours as needed for wheezing or shortness of breath. 8 g 2   cetirizine (ZYRTEC ALLERGY) 10 MG tablet Take 1 tablet (10 mg total) by mouth daily. 30 tablet 1   No facility-administered medications prior to visit.    No Known Allergies  ROS  Review of Systems  Constitutional: Negative.   HENT:  Positive for sneezing (intermittent sneezing). Negative for ear pain, facial swelling, nosebleeds, sore throat, tinnitus and trouble swallowing.   Eyes: Negative.   Cardiovascular: Negative.   Genitourinary: Negative.   Skin: Negative.   Allergic/Immunologic: Positive for environmental allergies (cat).  Neurological: Negative.   Psychiatric/Behavioral: Negative.       Objective:    Physical Exam Constitutional:      Appearance: Normal appearance.  HENT:     Head: Normocephalic.     Nose: Nose normal.  Cardiovascular:     Rate and Rhythm: Normal rate and regular rhythm.  Pulmonary:     Effort: Pulmonary effort is normal.     Breath sounds: Normal breath sounds.  Abdominal:     General: Bowel sounds are normal.     Palpations: Abdomen is soft.  Skin:    General: Skin is warm and  dry.     Capillary Refill: Capillary refill takes less than 2 seconds.  Neurological:     General: No focal deficit present.     Mental Status: She is alert and oriented to person, place, and time.  Psychiatric:        Mood and Affect: Mood normal.    BP 102/64 (BP Location: Right Arm, Patient Position: Sitting, Cuff Size: Normal) Comment: manual  Pulse 83   Temp 98.1 F (36.7 C)   Resp 16   Ht '5\' 1"'  (1.549 m)   Wt 134 lb 4.8 oz (60.9 kg)   LMP 12/30/2020 (Approximate)   SpO2 98%   BMI 25.38 kg/m  Wt Readings from Last 3 Encounters:  01/14/21 134 lb 4.8 oz (60.9 kg)  11/13/20 134 lb 8 oz (61 kg)  10/15/20 136 lb 9.6 oz (62 kg)     Health Maintenance Due  Topic Date Due   COVID-19 Vaccine (1) Never done   Pneumococcal Vaccine 24-63 Years old (1 - PCV) Never done   Hepatitis C Screening  Never done    There are no preventive care reminders to display for this patient.  No results found for: TSH Lab Results  Component Value Date   WBC 10.0 10/04/2020   HGB 13.1 10/04/2020   HCT 39.6 10/04/2020   MCV 89.0 10/04/2020   PLT 315 10/04/2020   Lab Results  Component Value Date   NA 138 10/15/2020   K 4.2 10/15/2020   CO2 21 10/15/2020   GLUCOSE 82 10/15/2020   BUN 15 10/15/2020   CREATININE 0.72 10/15/2020   BILITOT 0.4 10/15/2020   ALKPHOS 68 10/15/2020   AST 18 10/15/2020   ALT 15 10/15/2020   PROT 7.2 10/15/2020   ALBUMIN 4.8 10/15/2020   CALCIUM 9.7 10/15/2020   ANIONGAP 8 10/04/2020   EGFR 115 10/15/2020   No results found for: CHOL No results found for: HDL No results found for: LDLCALC No results found for: TRIG No results found for: CHOLHDL No results found for: HGBA1C    Assessment & Plan:   1. Allergy, subsequent encounter Allergy is improved  Continue current medication Continue to avoid environmental allergy triggers - albuterol (VENTOLIN HFA) 108 (90 Base) MCG/ACT inhaler; Inhale 2 puffs into the lungs every 6 (six) hours as needed for  wheezing or shortness of breath.  Dispense: 6.7 g; Refill: 3 - cetirizine (ZYRTEC ALLERGY) 10 MG tablet; Take 1 tablet (10 mg total) by mouth once daily.  Dispense: 30 tablet; Refill: 3   2. Cough Cough is improved, continue  to avoid allergy triggers Continue medication regimen   Follow-up: Return in about 22 weeks (around 06/17/2021), or if symptoms worsen or fail to improve.    Danella Maiers, RN

## 2021-01-14 NOTE — Progress Notes (Deleted)
Established Patient Office Visit  Subjective:  Patient ID: Michelle Arellano, female    DOB: [birth year]  Age: 30 y.o. MRN: [id number]  CC: No chief complaint on file.   HPI Michelle Arellano is a 30 y/o female who has history of Bell's palsy,presents for routine follow up and lab review. She states that her cough has resolved***. Her labs done on 10/15/20 were unremarkable.  Past Medical History:  Diagnosis Date   Bell's palsy     Past Surgical History:  Procedure Laterality Date   cyst removed     rt arm   WISDOM TOOTH EXTRACTION      Family History  Problem Relation Age of Onset   Diabetes Mother    Heart disease Maternal Grandmother    Heart failure Maternal Grandfather    Lung cancer Maternal Grandfather    Asthma Neg Hx    Ovarian cancer Neg Hx    Colon cancer Neg Hx    Breast cancer Neg Hx     Social History   Socioeconomic History   Marital status: Single    Spouse name: Not on file   Number of children: Not on file   Years of education: Not on file   Highest education level: Not on file  Occupational History   Not on file  Tobacco Use   Smoking status: Never   Smokeless tobacco: Never  Vaping Use   Vaping Use: Never used  Substance and Sexual Activity   Alcohol use: Yes    Comment: wine occasionally    Drug use: Yes    Frequency: 1.0 times per week    Types: Marijuana   Sexual activity: Yes    Partners: Male    Birth control/protection: Condom  Other Topics Concern   Not on file  Social History Narrative   Not on file   Social Determinants of Health   Financial Resource Strain: Not on file  Food Insecurity: Not on file  Transportation Needs: Not on file  Physical Activity: Not on file  Stress: Not on file  Social Connections: Not on file  Intimate Partner Violence: Not on file    Outpatient Medications Prior to Visit  Medication Sig Dispense Refill   albuterol (VENTOLIN HFA) 108 (90 Base) MCG/ACT inhaler Inhale 2 puffs into the lungs every 6  (six) hours as needed for wheezing or shortness of breath. 8 g 2   cetirizine (ZYRTEC ALLERGY) 10 MG tablet Take 1 tablet (10 mg total) by mouth daily. 30 tablet 1   No facility-administered medications prior to visit.    No Known Allergies  ROS Review of Systems    Objective:    Physical Exam  There were no vitals taken for this visit. Wt Readings from Last 3 Encounters:  11/13/20 134 lb 8 oz (61 kg)  10/15/20 136 lb 9.6 oz (62 kg)  10/04/20 114 lb (51.7 kg)     Health Maintenance Due  Topic Date Due   COVID-19 Vaccine (1) Never done   Pneumococcal Vaccine 60-49 Years old (1 - PCV) Never done   Hepatitis C Screening  Never done    There are no preventive care reminders to display for this patient.  No results found for: TSH Lab Results  Component Value Date   WBC 10.0 10/04/2020   HGB 13.1 10/04/2020   HCT 39.6 10/04/2020   MCV 89.0 10/04/2020   PLT 315 10/04/2020   Lab Results  Component Value Date   NA 138 10/15/2020  K 4.2 10/15/2020   CO2 21 10/15/2020   GLUCOSE 82 10/15/2020   BUN 15 10/15/2020   CREATININE 0.72 10/15/2020   BILITOT 0.4 10/15/2020   ALKPHOS 68 10/15/2020   AST 18 10/15/2020   ALT 15 10/15/2020   PROT 7.2 10/15/2020   ALBUMIN 4.8 10/15/2020   CALCIUM 9.7 10/15/2020   ANIONGAP 8 10/04/2020   EGFR 115 10/15/2020   No results found for: CHOL No results found for: HDL No results found for: LDLCALC No results found for: TRIG No results found for: CHOLHDL No results found for: HGBA1C    Assessment & Plan:   Problem List Items Addressed This Visit   None   No orders of the defined types were placed in this encounter.   Follow-up: No follow-ups on file.    Markees Carns Jerold Coombe, NP

## 2021-01-15 ENCOUNTER — Other Ambulatory Visit: Payer: Self-pay

## 2021-01-28 ENCOUNTER — Ambulatory Visit: Payer: Medicaid Other | Admitting: Pharmacist

## 2021-01-28 ENCOUNTER — Other Ambulatory Visit: Payer: Self-pay

## 2021-02-02 ENCOUNTER — Other Ambulatory Visit: Payer: Self-pay

## 2021-02-02 ENCOUNTER — Ambulatory Visit: Payer: Medicaid Other | Admitting: Pharmacist

## 2021-02-02 ENCOUNTER — Encounter: Payer: Self-pay | Admitting: Pharmacist

## 2021-02-02 DIAGNOSIS — Z79899 Other long term (current) drug therapy: Secondary | ICD-10-CM

## 2021-02-02 NOTE — Progress Notes (Signed)
[facility] Visit Note  Patient: JAYCE BOYKO MRN: [id number] Date of Birth: 04-08-1991 PCP: Rolm Gala, NP   Ivin Booty Naab 30 y.o. female presents in good spirits for a medication therapy management visit today via telephone. Pt was identified by name and DOB.   There were no vitals taken for this visit.  Patient Information   Past Medical History:  Diagnosis Date   Allergy    Bell's palsy       Past Surgical History:  Procedure Laterality Date   cyst removed     rt arm   WISDOM TOOTH EXTRACTION       Family History  Problem Relation Age of Onset   Diabetes Mother    Healthy Father    Diabetes Brother    Autism Brother    Heart disease Maternal Grandmother    Heart failure Maternal Grandfather    Lung cancer Maternal Grandfather    Healthy Paternal Grandmother    Kidney disease Paternal Grandfather    Asthma Neg Hx    Ovarian cancer Neg Hx    Colon cancer Neg Hx    Breast cancer Neg Hx    Lifestyle Diet: Consists of coffee ( 2 cups/ day), sweet tea for lunch and water in between. Pt endorses she eats salads, wraps, some fried foods(french fires, hamburgers), lots of fresh fruits and vegetables. She also states she eats every type of meat except for pork.   Exercise: 1 - 2 days per week/ 1.5 hr  Social History   Substance and Sexual Activity  Alcohol Use Yes   Comment: social      Social History   Tobacco Use  Smoking Status Never  Smokeless Tobacco Never   Health Maintenance  Topic Date Due   COVID-19 Vaccine (1) Never done   Pneumococcal Vaccine 63-40 Years old (1 - PCV) Never done   Hepatitis C Screening  Never done   INFLUENZA VACCINE  02/09/2021   PAP SMEAR-Modifier  09/24/2022   TETANUS/TDAP  01/08/2027   HIV Screening  Completed   HPV VACCINES  Aged Out   Health Maintenance/Date Completed  Last ED visit: - Last Visit to PCP: 01/14/2021 Next Visit to PCP: 06/17/2021 Specialist Visit: 11/13/20 OBGYN Dr. Serafina Royals, MD  Dental Exam: has not been recently but is willing to have a referral.   Eye Exam: states she had an exam earlier this year. ~ June 2022  Pelvic/PAP Exam: 11/13/2020 Mammogram: never; Offered brochure and pt is willing to learn about the [facility] mammogram program. Will mail brochure to address on file. Address was confirmed.   Colonoscopy: never  Flu Vaccine: no Pneumonia Vaccine: unsure COVID-19 Vaccine: no  Shingrix Vaccine: -- eligible when pt turns 30 YO.   Outpatient Encounter Medications as of 02/02/2021  Medication Sig   albuterol (VENTOLIN HFA) 108 (90 Base) MCG/ACT inhaler Inhale 2 puffs into the lungs every 6 (six) hours as needed for wheezing or shortness of breath.   cetirizine (ZYRTEC ALLERGY) 10 MG tablet Take 1 tablet (10 mg total) by mouth once daily.   [DISCONTINUED] albuterol (VENTOLIN HFA) 108 (90 Base) MCG/ACT inhaler Inhale 2 puffs into the lungs every 6 (six) hours as needed for wheezing or shortness of breath.   [DISCONTINUED] cetirizine (ZYRTEC ALLERGY) 10 MG tablet Take 1 tablet (10 mg total) by mouth daily.   No facility-administered encounter medications on file as of 02/02/2021.    Assessment and Plan:  Medication Management/Adherence:  Pt reports adherence  to all medications. [facility] is taking care of patient's medication needs at this time.   Allergies:  Current regimen consists of cetirizine and ventolin. Pt endorses she takes cetirizine daily at night. Reports improvement in her allergy symptoms and states she only needs to use Ventolin sometimes at night and throughout the day. Denies tremors, chest pain or palpitations. Also reports cough has resolved. Followed by: Eulogio Bear, NP.   FU with clinic in 1 year.   M. [place] Hiroyuki Ozanich, BS  PharmD Candidate [phone]  [employer]   Cosigned: Iona Beard. Joelene Millin, PharmD [facility] Operations  Coordinator [phone]

## 2021-02-19 ENCOUNTER — Other Ambulatory Visit: Payer: Self-pay

## 2021-03-10 ENCOUNTER — Other Ambulatory Visit: Payer: Self-pay

## 2021-04-02 ENCOUNTER — Other Ambulatory Visit: Payer: Self-pay

## 2021-04-22 ENCOUNTER — Other Ambulatory Visit: Payer: Self-pay

## 2021-05-08 ENCOUNTER — Other Ambulatory Visit: Payer: Self-pay

## 2021-05-21 ENCOUNTER — Other Ambulatory Visit: Payer: Self-pay

## 2021-06-01 NOTE — Progress Notes (Signed)
No-show, will reschedule.

## 2021-06-15 ENCOUNTER — Other Ambulatory Visit: Payer: Self-pay

## 2021-06-17 ENCOUNTER — Other Ambulatory Visit: Payer: Self-pay

## 2021-06-17 ENCOUNTER — Ambulatory Visit: Payer: Medicaid Other | Admitting: Gerontology

## 2021-06-17 ENCOUNTER — Encounter: Payer: Self-pay | Admitting: Gerontology

## 2021-06-17 VITALS — BP 104/62 | HR 71 | Temp 98.1°F | Resp 16 | Ht 62.0 in | Wt 135.8 lb

## 2021-06-17 DIAGNOSIS — R0981 Nasal congestion: Secondary | ICD-10-CM

## 2021-06-17 DIAGNOSIS — T7840XA Allergy, unspecified, initial encounter: Secondary | ICD-10-CM

## 2021-06-17 DIAGNOSIS — Z0000 Encounter for general adult medical examination without abnormal findings: Secondary | ICD-10-CM

## 2021-06-17 MED ORDER — ALBUTEROL SULFATE HFA 108 (90 BASE) MCG/ACT IN AERS
2.0000 | INHALATION_SPRAY | Freq: Four times a day (QID) | RESPIRATORY_TRACT | 6 refills | Status: AC | PRN
  Filled 2021-06-17 – 2021-07-17 (×2): qty 6.7, 25d supply, fill #0
  Filled 2021-08-13: qty 6.7, 25d supply, fill #1
  Filled 2021-09-06: qty 6.7, 25d supply, fill #2
  Filled 2021-10-07: qty 6.7, 25d supply, fill #3

## 2021-06-17 MED ORDER — CETIRIZINE HCL 10 MG PO TABS
10.0000 mg | ORAL_TABLET | Freq: Every day | ORAL | 6 refills | Status: AC
  Filled 2021-06-17: qty 30, 30d supply, fill #0
  Filled 2021-07-17: qty 30, 30d supply, fill #1
  Filled 2021-08-13: qty 30, 30d supply, fill #2
  Filled 2021-09-06: qty 90, 90d supply, fill #3

## 2021-06-17 MED ORDER — FLUTICASONE PROPIONATE 50 MCG/ACT NA SUSP
1.0000 | Freq: Every day | NASAL | 0 refills | Status: AC
  Filled 2021-06-17: qty 16, 60d supply, fill #0

## 2021-06-17 NOTE — Progress Notes (Signed)
Established Patient Office Visit  Subjective:  Patient ID: Michelle Arellano, female    DOB: 1990-09-18  Age: 30 y.o. MRN: [id number]  CC:  Chief Complaint  Patient presents with   Follow-up    HPI Michelle Arellano is a 30 year old female with a past medical history of Bell's palsy, allergy, presents for a routine follow up visit. She states that she's compliant with her medications, denies side effects. She works in a [employer] and constantly exposed to dusts and she is allergic to cat. She c/o intermittent nasal congestion, denies rhinorrhea, sinus pressure. Overall, she states that she's doing well and offers no further complaint.  Past Medical History:  Diagnosis Date   Allergy    Bell's palsy     Past Surgical History:  Procedure Laterality Date   cyst removed     rt arm   WISDOM TOOTH EXTRACTION      Family History  Problem Relation Age of Onset   Diabetes Mother    Healthy Father    Diabetes Brother    Autism Brother    Heart disease Maternal Grandmother    Heart failure Maternal Grandfather    Lung cancer Maternal Grandfather    Healthy Paternal Grandmother    Kidney disease Paternal Grandfather    Asthma Neg Hx    Ovarian cancer Neg Hx    Colon cancer Neg Hx    Breast cancer Neg Hx     Social History   Socioeconomic History   Marital status: Single    Spouse name: Not on file   Number of children: Not on file   Years of education: Not on file   Highest education level: Not on file  Occupational History   Not on file  Tobacco Use   Smoking status: Never   Smokeless tobacco: Never  Vaping Use   Vaping Use: Never used  Substance and Sexual Activity   Alcohol use: Yes    Comment: social   Drug use: Yes    Frequency: 2.0 times per week    Types: Marijuana    Comment: CBD   Sexual activity: Yes    Partners: Male    Birth control/protection: Condom  Other Topics Concern   Not on file  Social History Narrative   Not on file   Social Determinants of  Health   Financial Resource Strain: Not on file  Food Insecurity: No Food Insecurity   Worried About Running Out of Food in the Last Year: Never true   Ran Out of Food in the Last Year: Never true  Transportation Needs: No Transportation Needs   Lack of Transportation (Medical): No   Lack of Transportation (Non-Medical): No  Physical Activity: Not on file  Stress: Not on file  Social Connections: Not on file  Intimate Partner Violence: Not on file    Outpatient Medications Prior to Visit  Medication Sig Dispense Refill   albuterol (VENTOLIN HFA) 108 (90 Base) MCG/ACT inhaler Inhale 2 puffs into the lungs once every 6 (six) hours as needed for wheezing or shortness of breath. 6.7 g 3   cetirizine (ZYRTEC ALLERGY) 10 MG tablet Take 1 tablet (10 mg total) by mouth once daily. 30 tablet 3   No facility-administered medications prior to visit.    No Known Allergies  ROS Review of Systems  Constitutional: Negative.   Respiratory: Negative.    Cardiovascular: Negative.   Endocrine: Negative.   Skin: Negative.   Neurological: Negative.   Psychiatric/Behavioral:  Negative.       Objective:    Physical Exam HENT:     Head: Normocephalic and atraumatic.     Nose: Congestion present. No rhinorrhea.     Right Turbinates: Not enlarged, swollen or pale.     Left Turbinates: Not enlarged, swollen or pale.     Right Sinus: No maxillary sinus tenderness or frontal sinus tenderness.     Left Sinus: No maxillary sinus tenderness or frontal sinus tenderness.  Eyes:     Pupils: Pupils are equal, round, and reactive to light.  Cardiovascular:     Rate and Rhythm: Normal rate and regular rhythm.     Pulses: Normal pulses.     Heart sounds: Normal heart sounds.  Pulmonary:     Effort: Pulmonary effort is normal.     Breath sounds: Normal breath sounds.  Skin:    General: Skin is warm.  Neurological:     General: No focal deficit present.     Mental Status: She is alert and oriented  to person, place, and time. Mental status is at baseline.  Psychiatric:        Mood and Affect: Mood normal.        Behavior: Behavior normal.        Thought Content: Thought content normal.        Judgment: Judgment normal.    BP 104/62 (BP Location: Right Arm, Patient Position: Sitting, Cuff Size: Normal)   Pulse 71   Temp 98.1 F (36.7 C) (Oral)   Resp 16   Ht '5\' 2"'  (1.575 m)   Wt 135 lb 12.8 oz (61.6 kg)   LMP 06/05/2021 (Exact Date)   SpO2 98%   BMI 24.84 kg/m  Wt Readings from Last 3 Encounters:  06/17/21 135 lb 12.8 oz (61.6 kg)  01/14/21 134 lb 4.8 oz (60.9 kg)  11/13/20 134 lb 8 oz (61 kg)     Health Maintenance Due  Topic Date Due   COVID-19 Vaccine (1) Never done   Pneumococcal Vaccine 93-57 Years old (1 - PCV) Never done   Hepatitis C Screening  Never done    There are no preventive care reminders to display for this patient.  No results found for: TSH Lab Results  Component Value Date   WBC 10.0 10/04/2020   HGB 13.1 10/04/2020   HCT 39.6 10/04/2020   MCV 89.0 10/04/2020   PLT 315 10/04/2020   Lab Results  Component Value Date   NA 138 10/15/2020   K 4.2 10/15/2020   CO2 21 10/15/2020   GLUCOSE 82 10/15/2020   BUN 15 10/15/2020   CREATININE 0.72 10/15/2020   BILITOT 0.4 10/15/2020   ALKPHOS 68 10/15/2020   AST 18 10/15/2020   ALT 15 10/15/2020   PROT 7.2 10/15/2020   ALBUMIN 4.8 10/15/2020   CALCIUM 9.7 10/15/2020   ANIONGAP 8 10/04/2020   EGFR 115 10/15/2020   No results found for: CHOL No results found for: HDL No results found for: LDLCALC No results found for: TRIG No results found for: CHOLHDL No results found for: HGBA1C    Assessment & Plan:    1. Allergy, initial encounter - She will continue he current treatment regimen and advised to notify clinic with any symptoms. - albuterol (VENTOLIN HFA) 108 (90 Base) MCG/ACT inhaler; Inhale 2 puffs into the lungs once every 6 (six) hours as needed for wheezing or shortness of  breath.  Dispense: 6.7 g; Refill: 6 - cetirizine (ZYRTEC ALLERGY) 10  MG tablet; Take 1 tablet (10 mg total) by mouth once daily.  Dispense: 30 tablet; Refill: 6  2. Nasal congestion - She was started on Flonase, educated on medication side effects and advised to notify clinic. - fluticasone (FLONASE) 50 MCG/ACT nasal spray; Place 1 spray into both nostrils daily.  Dispense: 16 g; Refill: 0  3. Health care maintenance - Routine labs will be checked. - Comp Met (CMET); Future - CBC w/Diff; Future - Urinalysis; Future - Lipid panel; Future    Follow-up: Return in about 4 months (around 10/29/2021), or if symptoms worsen or fail to improve.    Mong Neal Jerold Coombe, NP

## 2021-07-05 IMAGING — CR DG CHEST 2V
2 series · 2 of 2 positions shown · non-contrast
Comparison: None.

CLINICAL DATA: Shortness of breath.  Cough.

EXAM:
CHEST - 2 VIEW

[chest pa]
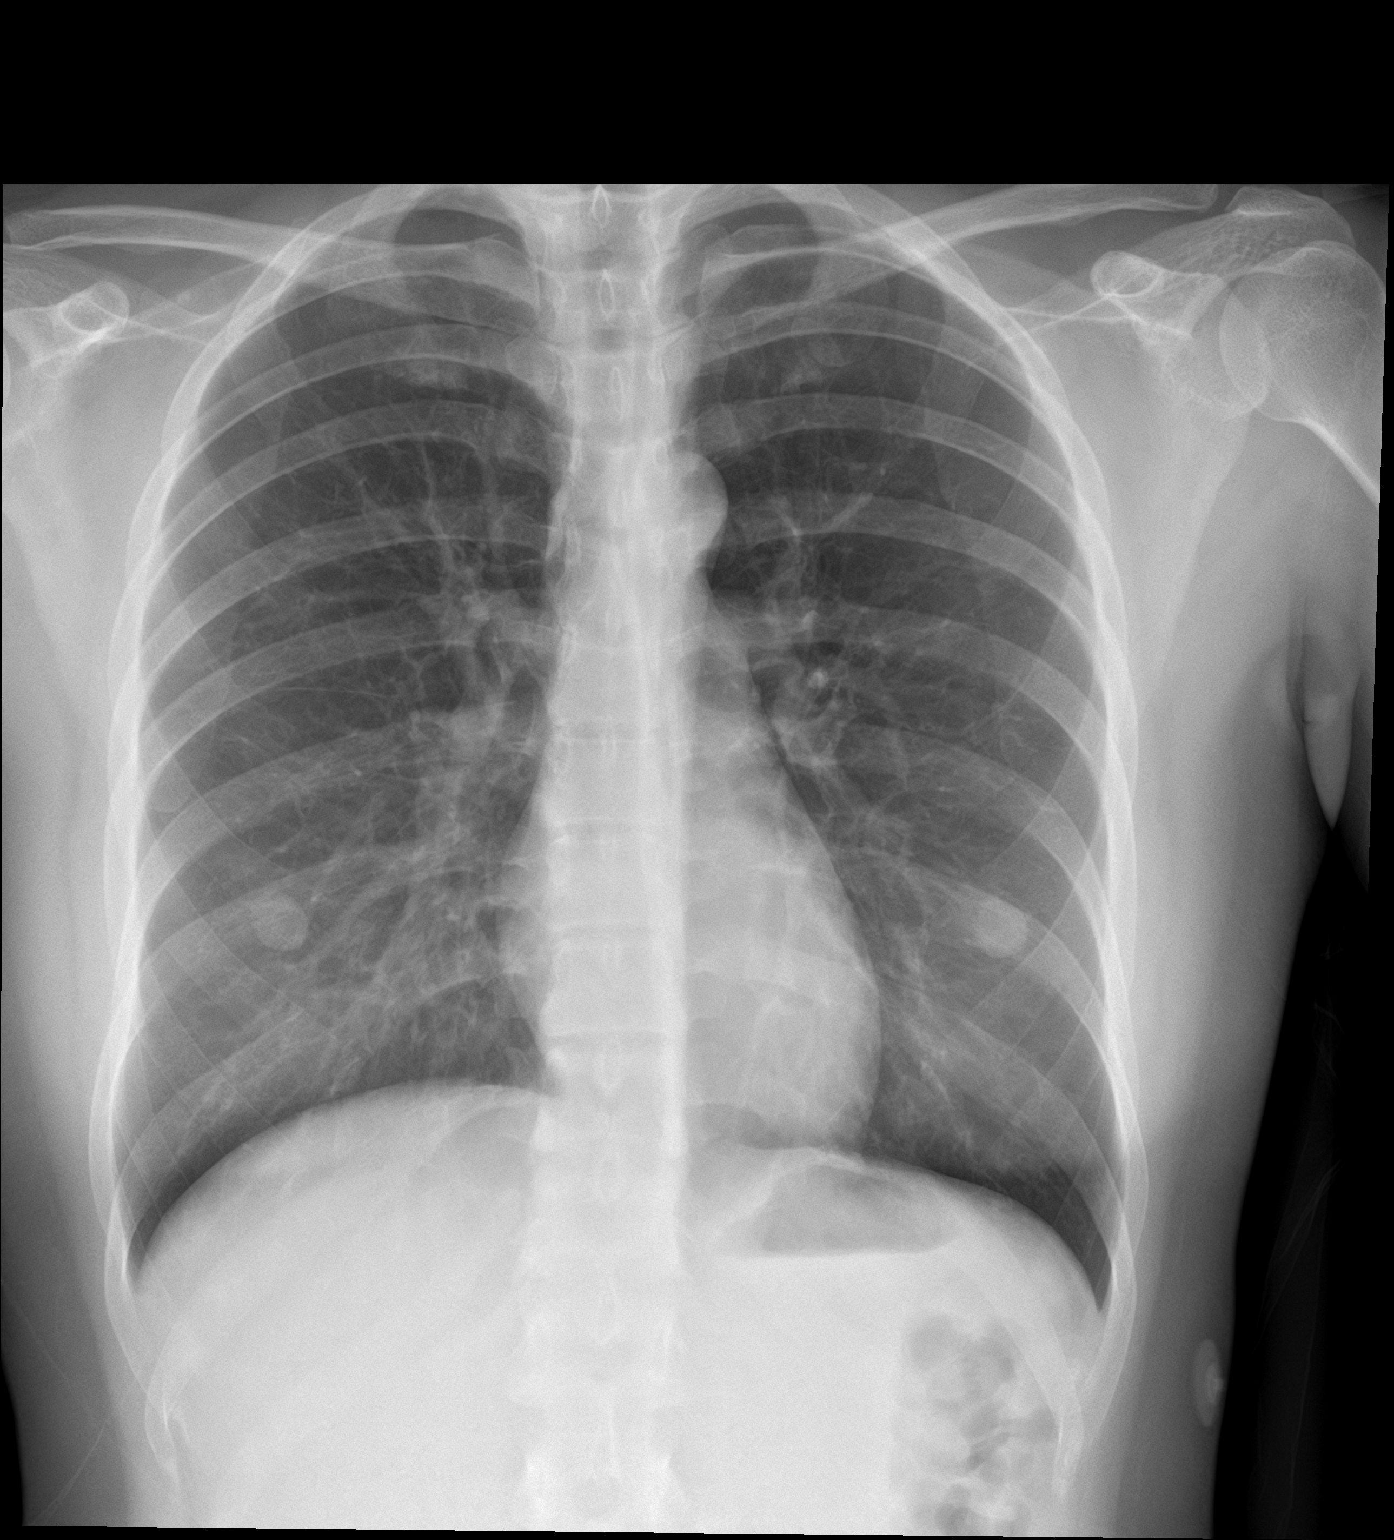

[chest lat]
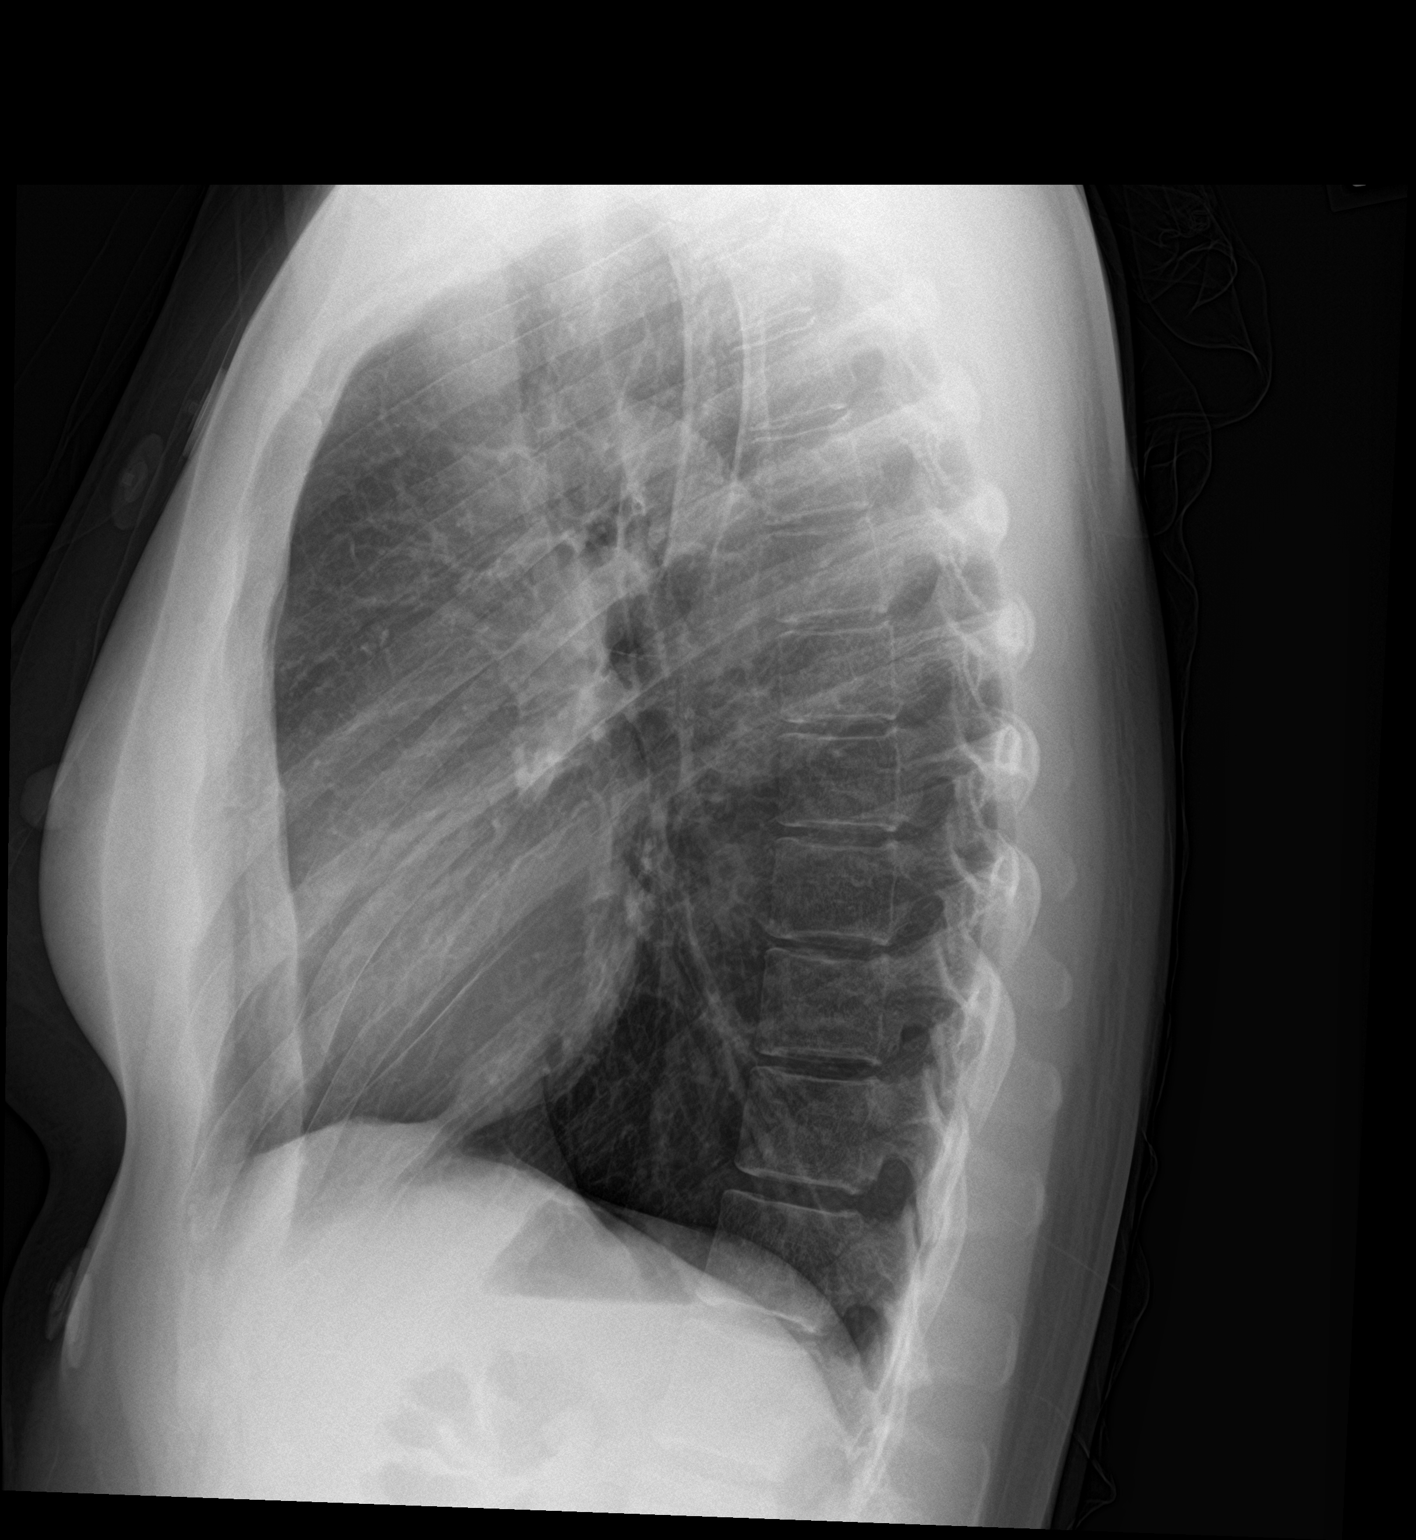

[2 of 2 positions shown; findings below may reference images not displayed]

FINDINGS: The cardiomediastinal contours are normal. The lungs are clear.
Pulmonary vasculature is normal. No consolidation, pleural effusion,
or pneumothorax. No acute osseous abnormalities are seen. Bilateral
nipple shadows noted.
IMPRESSION: Negative radiographs of the chest.

## 2021-07-17 ENCOUNTER — Other Ambulatory Visit: Payer: Self-pay

## 2021-08-13 ENCOUNTER — Other Ambulatory Visit: Payer: Self-pay

## 2021-09-07 ENCOUNTER — Other Ambulatory Visit: Payer: Self-pay

## 2021-10-07 ENCOUNTER — Other Ambulatory Visit: Payer: Self-pay

## 2021-10-21 ENCOUNTER — Other Ambulatory Visit: Payer: Medicaid Other

## 2021-10-28 ENCOUNTER — Ambulatory Visit: Payer: Medicaid Other | Admitting: Gerontology

## 2021-11-04 ENCOUNTER — Other Ambulatory Visit: Payer: Medicaid Other

## 2021-11-12 ENCOUNTER — Ambulatory Visit: Payer: Medicaid Other | Admitting: Gerontology

## 2021-11-13 ENCOUNTER — Other Ambulatory Visit: Payer: Self-pay

## 2021-11-16 ENCOUNTER — Encounter: Payer: Medicaid Other | Admitting: Certified Nurse Midwife

## 2021-11-26 ENCOUNTER — Ambulatory Visit (INDEPENDENT_AMBULATORY_CARE_PROVIDER_SITE_OTHER): Payer: BLUE CROSS/BLUE SHIELD | Admitting: Certified Nurse Midwife

## 2021-11-26 ENCOUNTER — Encounter: Payer: Self-pay | Admitting: Certified Nurse Midwife

## 2021-11-26 VITALS — BP 109/69 | HR 85 | Ht 62.0 in | Wt 139.0 lb

## 2021-11-26 DIAGNOSIS — Z01419 Encounter for gynecological examination (general) (routine) without abnormal findings: Secondary | ICD-10-CM | POA: Diagnosis not present

## 2021-11-26 NOTE — Patient Instructions (Signed)

## 2021-11-26 NOTE — Progress Notes (Signed)
GYNECOLOGY ANNUAL PREVENTATIVE CARE ENCOUNTER NOTE  History:     Michelle Arellano is a 31 y.o. G9P1011 female here for a routine annual gynecologic exam.  Current complaints: none.   Denies abnormal vaginal bleeding, discharge, pelvic pain, problems with intercourse or other gynecologic concerns.     Social Relationship: in relationship  Living: partner and daughter  Work: Automotive engineer yard in [employer] Exercise: 1 x wk 30 min-1hr Smoke/Alcohol/drug use: Occasional alcohol use, CBD, no smoking   Gynecologic History Patient's last menstrual period was 10/30/2021 (exact date). Contraception: IUD, para gaurd Last Pap: 09/2019. Results were: normal with negative HPV Last mammogram: n/a .    The pregnancy intention screening data noted above was reviewed. Potential methods of contraception were discussed. The patient elected to proceed with IUD.   Obstetric History OB History  Gravida Para Term Preterm AB Living  2 1 1   1 1   SAB IAB Ectopic Multiple Live Births    1   0 1    # Outcome Date GA Lbr Len/2nd Weight Sex Delivery Anes PTL Lv  2 Term 04/15/17 [redacted]w[redacted]d 02:43 / 06:54 6 lb 10 oz (3.005 kg) F Vag-Spont EPI  LIV  1 IAB 2006            Past Medical History:  Diagnosis Date   Allergy    Bell's palsy     Past Surgical History:  Procedure Laterality Date   cyst removed     rt arm   WISDOM TOOTH EXTRACTION      Current Outpatient Medications on File Prior to Visit  Medication Sig Dispense Refill   albuterol (PROVENTIL HFA) 108 (90 Base) MCG/ACT inhaler Inhale 2 puffs into the lungs once every 6 (six) hours as needed for wheezing or shortness of breath. 6.7 g 6   cetirizine (ZYRTEC ALLERGY) 10 MG tablet Take 1 tablet (10 mg total) by mouth once daily. 30 tablet 6   fluticasone (FLONASE) 50 MCG/ACT nasal spray Spray 1 spray into both nostrils once daily. 16 g 0   No current facility-administered medications on file prior to visit.    No Known Allergies  Social History:   reports that she has never smoked. She has never used smokeless tobacco. She reports current alcohol use. She reports current drug use. Frequency: 2.00 times per week. Drug: Marijuana.  Family History  Problem Relation Age of Onset   Diabetes Mother    Healthy Father    Diabetes Brother    Autism Brother    Heart disease Maternal Grandmother    Heart failure Maternal Grandfather    Lung cancer Maternal Grandfather    Healthy Paternal Grandmother    Kidney disease Paternal Grandfather    Asthma Neg Hx    Ovarian cancer Neg Hx    Colon cancer Neg Hx    Breast cancer Neg Hx     The following portions of the patient's history were reviewed and updated as appropriate: allergies, current medications, past family history, past medical history, past social history, past surgical history and problem list.  Review of Systems Pertinent items noted in HPI and remainder of comprehensive ROS otherwise negative.  Physical Exam:  BP 109/69   Pulse 85   Ht 5\' 2"  (1.575 m)   Wt 139 lb (63 kg)   LMP 10/30/2021 (Exact Date)   BMI 25.42 kg/m  CONSTITUTIONAL: Well-developed, well-nourished female in no acute distress.  HENT:  Normocephalic, atraumatic, External right and left ear normal. Oropharynx  is clear and moist EYES: Conjunctivae and EOM are normal. Pupils are equal, round, and reactive to light. No scleral icterus.  NECK: Normal range of motion, supple, no masses.  Normal thyroid.  SKIN: Skin is warm and dry. No rash noted. Not diaphoretic. No erythema. No pallor. MUSCULOSKELETAL: Normal range of motion. No tenderness.  No cyanosis, clubbing, or edema.  2+ distal pulses. NEUROLOGIC: Alert and oriented to person, place, and time. Normal reflexes, muscle tone coordination.  PSYCHIATRIC: Normal mood and affect. Normal behavior. Normal judgment and thought content. CARDIOVASCULAR: Normal heart rate noted, regular rhythm RESPIRATORY: Clear to auscultation bilaterally. Effort and breath sounds  normal, no problems with respiration noted. BREASTS: Symmetric in size. No masses, tenderness, skin changes, nipple drainage, or lymphadenopathy bilaterally.  ABDOMEN: Soft, no distention noted.  No tenderness, rebound or guarding.  PELVIC: Normal appearing external genitalia and urethral meatus; normal appearing vaginal mucosa and cervix.  No abnormal discharge noted.  Pap smear not collected.  Strings present. Normal uterine size, no other palpable masses, no uterine or adnexal tenderness.  .   Assessment and Plan:    1. Women's annual routine gynecological examination  . Pap: not due  Mammogram : n/a  Labs: declines will have done at PCP Refills: none  Referral: none  Routine preventative health maintenance measures emphasized. Please refer to After Visit Summary for other counseling recommendations.      Doreene Burke, CNM [facility],  [facility]

## 2022-11-24 ENCOUNTER — Other Ambulatory Visit: Payer: Self-pay
